# Patient Record
Sex: Female | Born: 1955
Health system: Southern US, Community
[De-identification: ages and names within clinical notes are randomized; demographics above are authoritative.]

## PROBLEM LIST (undated history)

## (undated) DIAGNOSIS — I10 Essential (primary) hypertension: Secondary | ICD-10-CM

## (undated) DIAGNOSIS — T8859XA Other complications of anesthesia, initial encounter: Secondary | ICD-10-CM

## (undated) DIAGNOSIS — M1990 Unspecified osteoarthritis, unspecified site: Secondary | ICD-10-CM

## (undated) DIAGNOSIS — T4145XA Adverse effect of unspecified anesthetic, initial encounter: Secondary | ICD-10-CM

## (undated) DIAGNOSIS — D649 Anemia, unspecified: Secondary | ICD-10-CM

## (undated) DIAGNOSIS — K219 Gastro-esophageal reflux disease without esophagitis: Secondary | ICD-10-CM

## (undated) DIAGNOSIS — E7800 Pure hypercholesterolemia, unspecified: Secondary | ICD-10-CM

## (undated) DIAGNOSIS — E119 Type 2 diabetes mellitus without complications: Secondary | ICD-10-CM

## (undated) DIAGNOSIS — K859 Acute pancreatitis without necrosis or infection, unspecified: Secondary | ICD-10-CM

## (undated) DIAGNOSIS — F419 Anxiety disorder, unspecified: Secondary | ICD-10-CM

## (undated) DIAGNOSIS — F32A Depression, unspecified: Secondary | ICD-10-CM

## (undated) DIAGNOSIS — F329 Major depressive disorder, single episode, unspecified: Secondary | ICD-10-CM

## (undated) HISTORY — PX: BACK SURGERY: SHX140

## (undated) HISTORY — PX: SHOULDER SURGERY: SHX246

## (undated) HISTORY — PX: TONSILLECTOMY: SUR1361

## (undated) HISTORY — PX: CHOLECYSTECTOMY: SHX55

## (undated) HISTORY — PX: COLONOSCOPY: SHX174

## (undated) HISTORY — PX: ABDOMINAL HYSTERECTOMY: SHX81

---

## 1998-08-03 ENCOUNTER — Ambulatory Visit (HOSPITAL_COMMUNITY): Admission: RE | Admit: 1998-08-03 | Discharge: 1998-08-03 | Payer: Self-pay | Admitting: Gynecology

## 1998-08-05 ENCOUNTER — Inpatient Hospital Stay (HOSPITAL_COMMUNITY): Admission: RE | Admit: 1998-08-05 | Discharge: 1998-08-08 | Payer: Self-pay | Admitting: Gynecology

## 1998-12-02 ENCOUNTER — Encounter: Admission: RE | Admit: 1998-12-02 | Discharge: 1999-03-02 | Payer: Self-pay | Admitting: Internal Medicine

## 1999-09-18 ENCOUNTER — Encounter: Payer: Self-pay | Admitting: Emergency Medicine

## 1999-09-18 ENCOUNTER — Inpatient Hospital Stay (HOSPITAL_COMMUNITY): Admission: EM | Admit: 1999-09-18 | Discharge: 1999-09-21 | Payer: Self-pay | Admitting: *Deleted

## 1999-09-19 ENCOUNTER — Encounter: Payer: Self-pay | Admitting: Neurology

## 2000-03-26 ENCOUNTER — Encounter: Admission: RE | Admit: 2000-03-26 | Discharge: 2000-04-05 | Payer: Self-pay | Admitting: Internal Medicine

## 2000-11-29 ENCOUNTER — Encounter: Admission: RE | Admit: 2000-11-29 | Discharge: 2000-11-29 | Payer: Self-pay | Admitting: Internal Medicine

## 2000-11-29 ENCOUNTER — Encounter: Payer: Self-pay | Admitting: Internal Medicine

## 2000-12-17 ENCOUNTER — Encounter: Payer: Self-pay | Admitting: Neurosurgery

## 2000-12-19 ENCOUNTER — Ambulatory Visit (HOSPITAL_COMMUNITY): Admission: RE | Admit: 2000-12-19 | Discharge: 2000-12-20 | Payer: Self-pay | Admitting: Neurosurgery

## 2000-12-19 ENCOUNTER — Encounter: Payer: Self-pay | Admitting: Neurosurgery

## 2002-03-24 ENCOUNTER — Encounter: Payer: Self-pay | Admitting: Internal Medicine

## 2002-03-24 ENCOUNTER — Encounter: Admission: RE | Admit: 2002-03-24 | Discharge: 2002-03-24 | Payer: Self-pay | Admitting: Internal Medicine

## 2002-04-02 ENCOUNTER — Encounter: Admission: RE | Admit: 2002-04-02 | Discharge: 2002-04-02 | Payer: Self-pay | Admitting: Internal Medicine

## 2002-04-02 ENCOUNTER — Encounter: Payer: Self-pay | Admitting: Internal Medicine

## 2002-07-17 ENCOUNTER — Inpatient Hospital Stay (HOSPITAL_COMMUNITY): Admission: EM | Admit: 2002-07-17 | Discharge: 2002-07-23 | Payer: Self-pay | Admitting: Psychiatry

## 2002-09-23 ENCOUNTER — Encounter: Payer: Self-pay | Admitting: Internal Medicine

## 2002-09-23 ENCOUNTER — Encounter: Admission: RE | Admit: 2002-09-23 | Discharge: 2002-09-23 | Payer: Self-pay | Admitting: Internal Medicine

## 2003-04-21 ENCOUNTER — Other Ambulatory Visit: Admission: RE | Admit: 2003-04-21 | Discharge: 2003-04-21 | Payer: Self-pay | Admitting: Obstetrics and Gynecology

## 2004-12-20 ENCOUNTER — Encounter: Admission: RE | Admit: 2004-12-20 | Discharge: 2004-12-20 | Payer: Self-pay | Admitting: Internal Medicine

## 2005-12-01 ENCOUNTER — Encounter: Admission: RE | Admit: 2005-12-01 | Discharge: 2005-12-01 | Payer: Self-pay | Admitting: Internal Medicine

## 2006-01-17 ENCOUNTER — Encounter: Admission: RE | Admit: 2006-01-17 | Discharge: 2006-01-17 | Payer: Self-pay | Admitting: Internal Medicine

## 2007-02-28 ENCOUNTER — Encounter: Admission: RE | Admit: 2007-02-28 | Discharge: 2007-02-28 | Payer: Self-pay | Admitting: Internal Medicine

## 2009-01-01 ENCOUNTER — Observation Stay (HOSPITAL_COMMUNITY): Admission: EM | Admit: 2009-01-01 | Discharge: 2009-01-04 | Payer: Self-pay | Admitting: Emergency Medicine

## 2009-01-04 ENCOUNTER — Inpatient Hospital Stay (HOSPITAL_COMMUNITY): Admission: AD | Admit: 2009-01-04 | Discharge: 2009-01-07 | Payer: Self-pay | Admitting: Psychiatry

## 2009-01-04 ENCOUNTER — Ambulatory Visit: Payer: Self-pay | Admitting: Psychiatry

## 2010-01-04 ENCOUNTER — Encounter: Admission: RE | Admit: 2010-01-04 | Discharge: 2010-01-04 | Payer: Self-pay | Admitting: Internal Medicine

## 2010-09-18 LAB — DIFFERENTIAL
Eosinophils Absolute: 0.3 10*3/uL (ref 0.0–0.7)
Eosinophils Relative: 3 % (ref 0–5)
Lymphocytes Relative: 44 % (ref 12–46)
Monocytes Absolute: 0.8 10*3/uL (ref 0.1–1.0)
Monocytes Relative: 7 % (ref 3–12)
Neutrophils Relative %: 46 % (ref 43–77)

## 2010-09-18 LAB — CBC
MCHC: 35.8 g/dL (ref 30.0–36.0)
MCV: 86.9 fL (ref 78.0–100.0)
Platelets: 277 10*3/uL (ref 150–400)
RDW: 13.7 % (ref 11.5–15.5)
WBC: 11.3 10*3/uL — ABNORMAL HIGH (ref 4.0–10.5)

## 2010-09-18 LAB — URINALYSIS, ROUTINE W REFLEX MICROSCOPIC
Bilirubin Urine: NEGATIVE
Glucose, UA: NEGATIVE mg/dL
Nitrite: NEGATIVE
Specific Gravity, Urine: 1.006 (ref 1.005–1.030)

## 2010-09-18 LAB — GLUCOSE, CAPILLARY
Glucose-Capillary: 100 mg/dL — ABNORMAL HIGH (ref 70–99)
Glucose-Capillary: 102 mg/dL — ABNORMAL HIGH (ref 70–99)
Glucose-Capillary: 111 mg/dL — ABNORMAL HIGH (ref 70–99)
Glucose-Capillary: 122 mg/dL — ABNORMAL HIGH (ref 70–99)
Glucose-Capillary: 130 mg/dL — ABNORMAL HIGH (ref 70–99)
Glucose-Capillary: 160 mg/dL — ABNORMAL HIGH (ref 70–99)
Glucose-Capillary: 199 mg/dL — ABNORMAL HIGH (ref 70–99)
Glucose-Capillary: 78 mg/dL (ref 70–99)
Glucose-Capillary: 83 mg/dL (ref 70–99)
Glucose-Capillary: 99 mg/dL (ref 70–99)
Glucose-Capillary: 143 mg/dL — ABNORMAL HIGH (ref 70–99)
Glucose-Capillary: 224 mg/dL — ABNORMAL HIGH (ref 70–99)

## 2010-09-18 LAB — RAPID URINE DRUG SCREEN, HOSP PERFORMED
Cocaine: NOT DETECTED
Opiates: NOT DETECTED
Tetrahydrocannabinol: NOT DETECTED

## 2010-09-18 LAB — POCT I-STAT, CHEM 8
Glucose, Bld: 115 mg/dL — ABNORMAL HIGH (ref 70–99)
Hemoglobin: 13.3 g/dL (ref 12.0–15.0)
TCO2: 20 mmol/L (ref 0–100)

## 2010-09-18 LAB — POCT PREGNANCY, URINE: Preg Test, Ur: NEGATIVE

## 2010-09-29 ENCOUNTER — Emergency Department (HOSPITAL_COMMUNITY): Payer: Medicare Other

## 2010-09-29 ENCOUNTER — Observation Stay (HOSPITAL_COMMUNITY)
Admission: EM | Admit: 2010-09-29 | Discharge: 2010-09-30 | Disposition: A | Discharge status: Home / Self Care | Payer: Medicare Other | Attending: Internal Medicine | Admitting: Internal Medicine

## 2010-09-29 DIAGNOSIS — R209 Unspecified disturbances of skin sensation: Secondary | ICD-10-CM | POA: Insufficient documentation

## 2010-09-29 DIAGNOSIS — F411 Generalized anxiety disorder: Secondary | ICD-10-CM | POA: Insufficient documentation

## 2010-09-29 DIAGNOSIS — R079 Chest pain, unspecified: Principal | ICD-10-CM | POA: Insufficient documentation

## 2010-09-29 DIAGNOSIS — R05 Cough: Secondary | ICD-10-CM | POA: Insufficient documentation

## 2010-09-29 DIAGNOSIS — E119 Type 2 diabetes mellitus without complications: Secondary | ICD-10-CM | POA: Insufficient documentation

## 2010-09-29 DIAGNOSIS — R059 Cough, unspecified: Secondary | ICD-10-CM | POA: Insufficient documentation

## 2010-09-29 DIAGNOSIS — R630 Anorexia: Secondary | ICD-10-CM | POA: Insufficient documentation

## 2010-09-29 DIAGNOSIS — F988 Other specified behavioral and emotional disorders with onset usually occurring in childhood and adolescence: Secondary | ICD-10-CM | POA: Insufficient documentation

## 2010-09-29 DIAGNOSIS — E785 Hyperlipidemia, unspecified: Secondary | ICD-10-CM | POA: Insufficient documentation

## 2010-09-29 DIAGNOSIS — F3289 Other specified depressive episodes: Secondary | ICD-10-CM | POA: Insufficient documentation

## 2010-09-29 DIAGNOSIS — I10 Essential (primary) hypertension: Secondary | ICD-10-CM | POA: Insufficient documentation

## 2010-09-29 DIAGNOSIS — F329 Major depressive disorder, single episode, unspecified: Secondary | ICD-10-CM | POA: Insufficient documentation

## 2010-09-29 LAB — POCT I-STAT, CHEM 8
BUN: 19 mg/dL (ref 6–23)
Calcium, Ion: 1.16 mmol/L (ref 1.12–1.32)
Chloride: 105 mEq/L (ref 96–112)
Creatinine, Ser: 1 mg/dL (ref 0.4–1.2)

## 2010-09-29 LAB — CK TOTAL AND CKMB (NOT AT ARMC)
CK, MB: 2.6 ng/mL (ref 0.3–4.0)
Relative Index: 1.8 (ref 0.0–2.5)

## 2010-09-29 LAB — BASIC METABOLIC PANEL
GFR calc non Af Amer: >60 mL/min (ref >60)
Potassium: 5.9 mEq/L — ABNORMAL HIGH (ref 3.5–5.1)
Sodium: 127 mEq/L — ABNORMAL LOW (ref 135–145)

## 2010-09-29 LAB — CBC
HCT: 35.5 % — ABNORMAL LOW (ref 36.0–46.0)
MCV: 86 fL (ref 78.0–100.0)
RBC: 4.13 MIL/uL (ref 3.87–5.11)
RDW: 13.9 % (ref 11.5–15.5)
WBC: 9 10*3/uL (ref 4.0–10.5)

## 2010-09-29 LAB — DIFFERENTIAL
Basophils Relative: 1 % (ref 0–1)
Eosinophils Relative: 3 % (ref 0–5)
Monocytes Absolute: 1.3 10*3/uL — ABNORMAL HIGH (ref 0.1–1.0)
Neutro Abs: 4.8 10*3/uL (ref 1.7–7.7)
Neutrophils Relative %: 54 % (ref 43–77)

## 2010-09-29 LAB — POCT CARDIAC MARKERS
CKMB, poc: 2.3 ng/mL (ref 1.0–8.0)
Troponin i, poc: <0.05 ng/mL (ref 0.00–0.09)

## 2010-09-29 LAB — GLUCOSE, CAPILLARY: Glucose-Capillary: 214 mg/dL — ABNORMAL HIGH (ref 70–99)

## 2010-09-30 LAB — LIPID PANEL: LDL Cholesterol: UNDETERMINED mg/dL (ref 0–99)

## 2010-09-30 LAB — GLUCOSE, CAPILLARY
Glucose-Capillary: 126 mg/dL — ABNORMAL HIGH (ref 70–99)
Glucose-Capillary: 152 mg/dL — ABNORMAL HIGH (ref 70–99)

## 2010-09-30 LAB — CARDIAC PANEL(CRET KIN+CKTOT+MB+TROPI)
CK, MB: 1.9 ng/mL (ref 0.3–4.0)
Relative Index: 1.8 (ref 0.0–2.5)
Total CK: 48 U/L (ref 7–177)
Troponin I: 0.02 ng/mL (ref 0.00–0.06)

## 2010-09-30 LAB — BASIC METABOLIC PANEL
GFR calc non Af Amer: >60 mL/min (ref >60)
Potassium: 4 mEq/L (ref 3.5–5.1)
Sodium: 136 mEq/L (ref 135–145)

## 2010-10-01 NOTE — H&P (Addendum)
[name]              ACCOUNT NO.:  [id number]  MEDICAL RECORD NO.:  [id number]           PATIENT TYPE:  E  LOCATION:  [facility]                         FACILITY:  [facility]  PHYSICIAN:  Lonia Blood, M.D.       DATE OF BIRTH:  [birth year]  DATE OF ADMISSION:  09/29/2010 DATE OF DISCHARGE:                             HISTORY & PHYSICAL   PRIMARY CARE PROVIDER:  Deirdre Peer. Polite, MD  HISTORY OF PRESENT ILLNESS:  [name] is a 55 year old female with a history of anxiety and depression who presents to the [facility] ED with chief complaint of chest pain and some upper extremity numbness, tingling, and anxiety.  She indicates that this symptoms started about 3 days ago and have persisted on an intermittent basis until today when the chest pain became constant.  She describes the pain as sharp and located in the substernal area with occasional radiation to the left anterior chest. She denies any diaphoresis, nausea, vomiting, headache, or visual disturbances.  She denies any recent illness, abdominal pain, or diarrhea.  She rates the pain 5/10 and it is worst and 2/10 at best. She indicates that activity does not affect with pain and that there seems to be no relationship with the deep inspiration.  Symptoms came on, gradually have persisted.  Symptoms are characterized as mild to moderate.  We are asked to admit for further evaluation and treatment.  ALLERGIES:  No known drug allergies.  PAST MEDICAL HISTORY: 1. Schizo-affective disorder with depressed mood. 2. Attention deficit disorder. 3. Hypertension. 4. Elevated triglycerides. 5. Depression and anxiety. 6. Diabetes type 2. 7. Questionable history of TIA. 8. Seasonal allergies.  PAST SURGICAL HISTORY: 1. Remote tonsillectomy. 2. Cholecystectomy in 1999. 3. Hysterectomy in 2000. 4. Shoulder surgery. 5. Hemilaminectomy and diskectomy at L5-S1 in 2002.  FAMILY MEDICAL HISTORY:  Was reviewed with the patient and  is noncontributory to this admission.  SOCIAL HISTORY:  The patient is married.  Denies tobacco use.  Denies EtOH.  Denies IV drug use.  MEDICATIONS: 1. Tylenol 325 mg 2 tabs p.o. daily. 2. Biotin 500 mcg p.o. b.i.d. 3. Prilosec 20 mg p.o. daily. 4. Aspirin 81 mg p.o. daily. 5. Carbamazepine 200 mg p.o. 0.5 tabs 4 times daily. 6. Bupropion XL 150 mg p.o. 3 tablets at bedtime. 7. Trazodone 100 mg p.o. 0.5 tabs every evening. 8. Metformin 500 mg p.o. 1-2 tabs b.i.d. takes 2 tabs in the morning     and 1 tablet at night. 9. Lisinopril 20 mg p.o. 1 tab daily. 10.Paroxetine 40 mg p.o. 1.5 tab every evening. 11.Simvastatin 20 mg p.o. 1 tablet daily. 12.Januvia 100 mg p.o. 1 tab daily.  REVIEW OF SYSTEMS:  GENERAL:  Negative for fever, chills, anorexia, and unintentional weight loss. ENT:  Negative for ear pain, nasal congestion, and sore throat. CARDIOVASCULAR:  See HPI. RESPIRATORY:  Negative for increased work of breathing or cough. MUSCULOSKELETAL:  Positive for some chronic intermittent neck pain. Negative for muscle weakness. NEUROLOGIC:  Negative for headache, visual disturbances.  Positive for some intermittent numbness, tingling of her upper extremities. GASTROINTESTINAL:  Negative abdominal pain, nausea, vomiting,  constipation, diarrhea, or melena. GENITOURINARY:  Negative dysuria, hematuria, frequency, or urgency. PSYCHIATRIC:  Positive for depression and anxiety. HEMATOLOGIC:  Negative for any unusual bruising or bleeding.  LABORATORY DATA:  Sodium 127, potassium 5.9 however hemolyzed specimen. Chloride 97, CO2 of 20, BUN 13, creatinine 0.42, and glucose 147.  WBC 9.0, hemoglobin 13.1, hematocrit 35.5, and platelets 275.  CK-MB 2.3, troponin 1 less than 0.05, myoglobin 73.8.  RADIOLOGY DATA:  Chest x-ray yields negative.  No acute cardiopulmonary abnormality.  PHYSICAL EXAMINATION:  VITAL SIGNS:  T 98.2, blood pressure 134/69, heart rate is 69, respiration is 22, and  sats 100% on room air. GENERAL:  Awake, alert, well-nourished, and well-hydrated.  No acute distress. HEAD:  Normocephalic, atraumatic.  Pupils are equal, round, and reactive to light.  EOMI.  Mucous membranes of her mouth are moist and pink.  No obvious lesion or exudate in nose or ears. NECK:  Supple.  No JVD.  Full range of motion.  No lymphadenopathy. CARDIOVASCULAR:  Regular rate and rhythm.  No murmur, gallop, or rub. No lower extremity edema. RESPIRATORY:  No increased work of breathing.  Breath sounds clear to auscultation bilaterally.  No rhonchi, wheezes, or rales. ABDOMEN:  Round, soft.  Positive bowel sounds throughout.  Nontender to palpation.  No mass or organomegaly noted. NEUROLOGIC:  Alert and oriented x3.  Speech clear.  Facial symmetry. Cranial nerves II through XII are grossly intact. MUSCULOSKELETAL:  Moves all extremities.  No joint swelling/erythema. EXTREMITIES:  Without clubbing or cyanosis.  ASSESSMENT/PLAN: 1. Chest pain.  Atypical.  We will admit to tele for observation.  We     will cycle cardiac enzymes, provide O2 support, as well as nitro,     morphine, and an antiemetic as needed.  We will repeat an EKG in     the a.m.  We will continue her aspirin. 2. Hyponatremia.  We will gently hydrate and recheck in the a.m. 3. Hyperkalemia hemolyzed specimen.  We will repeat. 4. Diabetes type 2.  We will check her hemoglobin A1c and use sliding     scale glycemic control.  We will continue her home meds. 5. Hyperlipidemia.  We will get a fasting lipid panel.  Continue her     statin. 6. Depression/anxiety.  Currently seems to be at baseline.  We will     continue her home meds. 7. DVT prophylaxis.  We will use Lovenox. 8. Code status.  The patient is a full code.  This assessment and plan was discussed with Dr. Lavera Guise.     Gwenyth Bender, NP   ______________________________ Lonia Blood, M.D.    KMB/MEDQ  D:  09/29/2010  T:  09/29/2010  Job:   [id number]  cc:   Deirdre Peer. Polite, M.D.  Electronically Signed by Lonia Blood M.D. on 10/01/2010 03:46:10 PM Electronically Signed by Toya Smothers  on 10/06/2010 03:09:31 PM

## 2010-10-25 NOTE — Discharge Summary (Signed)
NAMEAMORI, [name]              ACCOUNT NO.:  [id number]   MEDICAL RECORD NO.:  [id number]          PATIENT TYPE:  OBV   LOCATION:  4740                         FACILITY:  [facility]   PHYSICIAN:  Theressa Millard, M.D.    DATE OF BIRTH:  1955-06-18   DATE OF ADMISSION:  01/01/2009  DATE OF DISCHARGE:  01/03/2009                               DISCHARGE SUMMARY   ADMISSION DIAGNOSIS:  Drug overdose.   DISCHARGE DIAGNOSES:  1. Drug overdose with clonazepam.  2. Schizoaffective disorder.  3. Diabetes mellitus.  4. Hypertension.  5. Allergic rhinitis.   The patient is a 55 year old white female with a history of a  schizoaffective disorder who had an argument with her husband on the  night prior to admission and took somewhere between 80 and 100 Klonopin  0.5 mg.  She was brought to the emergency department.   HOSPITAL COURSE:  The patient was admitted for observation because of  sedation risks and concern of lowering seizure threshold.  Her other  psychotropic medications were held except for paroxetine which was  continued to avoid withdrawal.  She did well over the subsequent 36  hours and felt much better.  She was seen in consultation by Dr. Dub Mikes  and is willing to go to [facility] for additional  therapies, and this will be arranged at this time.   Please note that the patient's lisinopril is being held at this point.  She has a history of hypertension but has recently loss 50 pounds.  We  noted that her blood pressure was quite good in acute care hospital  without lisinopril, so this will be held at this point.  However, if her  blood pressure starts to rise as she is up and about, lisinopril 10 mg  daily could be resumed.  She has also had some problems with diarrhea  from glipizide, although she has tolerated metformin without problems.  At this point we will hold glipizide as well.   DISCHARGE MEDICATIONS:  1. Metformin 500 mg 2 tablets b.i.d. with food.  2.  Simvastatin 20 mg daily.  3. Baby aspirin daily.  4. Claritin 10 mg daily.  5. Paroxetine ER 25 mg 2 daily.   Other medications which were not given in this hospitalization but were  taken prior to admission include lamotrigine 100 mg one daily, bupropion  XL 150 mg 3 daily and clonazepam 0.5 mg 2 daily p.r.n.   ACTIVITY:  As tolerated.   DIET:  No added salt, modified carbohydrate.   She will be transferred voluntarily to [facility].      Theressa Millard, M.D.  Electronically Signed     JO/MEDQ  D:  01/03/2009  T:  01/03/2009  Job:  [id number]

## 2010-10-25 NOTE — Discharge Summary (Signed)
  NAMEZAYNE, [name]              ACCOUNT NO.:  [id number]  MEDICAL RECORD NO.:  [id number]           PATIENT TYPE:  LOCATION:                                 FACILITY:  PHYSICIAN:  Deirdre Peer. Domenic Schoenberger, M.D. DATE OF BIRTH:  1955/09/12  DATE OF ADMISSION: DATE OF DISCHARGE:                              DISCHARGE SUMMARY   DISCHARGE DIAGNOSES: 1. Atypical chest pain, enzymes negative for ischemia, EKG without     acute changes. Discharged to home in stable condition.  Of note,     the patient had previous outpatient stress test in 2010 which was     normal. 2. Hyponatremia, resolved after intravenous fluids.  The patient will     have further outpatient followup, rule out related to new     psychiatric medication, carbamazepine. 3. Diabetes. 4. Hypertension. 5. Schizoaffective disorder.  DISCHARGE MEDICATIONS: 1. Aspirin 81 mg daily. 2. Biotin b.i.d. 3. Bupropion 150 mg three tablets daily. 4. Carbamazepine 200 mg half tablet four times a day. 5. Januvia 100 mg daily. 6. Lisinopril 20 mg daily. 7. Metformin 500 mg two tabs in the morning, one in the p.m. 8. Omeprazole 20 mg daily. 9. Paroxetine 40 mg one and half daily. 10.Simvastatin 20 mg daily. 11.Trazodone 100 mg half nightly.  DISPOSITION:  Discharged home in stable condition.  HISTORY OF PRESENT ILLNESS:  This 55 year old female presents to the ED with complaint of chest pain at rest.  Because of risk factors, admission was deemed necessary for rule out.  Please see dictated H and P for further details.  Past medical history, medications, social history, past surgical history, allergies, family history per admission H and P.  HOSPITAL COURSE:  The patient was admitted to the telemetry floor bed for evaluation and treatment of chest pain.  The patient had serial cardiac enzymes which were negative for ischemia.  EKG without any acute changes.  Chest x-ray was negative for acute cardiopulmonary abnormality.  There  was no recurrence of chest pain.  The patient's symptoms were atypical and because of her having a normal stress test in 2010, she was felt to be stable for discharge to home.  The patient did have hyponatremia.  Followup labs showed normalization of sodium, we will have further outpatient check on this issue.  The patient has several other chronic medical problems which were stable.  She will continue meds as discussed above.     Deirdre Peer. Peregrine Nolt, M.D.     RDP/MEDQ  D:  10/05/2010  T:  10/05/2010  Job:  [id number]  Electronically Signed by Windy Fast Penney Domanski M.D. on 10/25/2010 02:42:52 PM

## 2010-10-25 NOTE — H&P (Signed)
NAMESHAKETHA, [name]              ACCOUNT NO.:  [id number]   MEDICAL RECORD NO.:  [id number]          PATIENT TYPE:  EMS   LOCATION:  [facility]                         FACILITY:  [facility]   PHYSICIAN:  Ramiro Harvest, MD    DATE OF BIRTH:  May 26, 1956   DATE OF ADMISSION:  01/01/2009  DATE OF DISCHARGE:                              HISTORY & PHYSICAL   PRIMARY CARE PHYSICIAN:  Deirdre Peer. Polite, M.D. of [place].   HISTORY OF PRESENT ILLNESS:  [name] is a 55 year old white  female with history of schizoaffective disorder with depressed mood,  depression, anxiety, hypertension, hyperlipidemia, diabetes who presents  to the ED with a drug overdose.  Per patient and her husband, the  patient got into an argument with her husband the night prior to  admission.  After the fight, the patient decided to go to her room to be  away from her husband.  The patient's husband went to check on her 45  minutes later.  The patient asked for a drink for her medications and  her medications.  The patient's husband got her medications as well as a  drink and then after that the patient went to bed.  The patient awoke  around 3:00 a.m. was with incoherent speech and on the way to the  bathroom was dragging herself unusually.  Husband asked her how much of  the medication she took, and she had stated that she took the whole  bottle of her Klonopin which was approximately about 80-100 tablets left  in it.  The patient's husband proceeded to bring the patient to the ED.  The patient stated that she wanted to escape and go away.  Also,  positive for suicidal ideations.  In the ED, urinalysis done was  negative.  UDS was positive for benzodiazepines.  Urine pregnancy was  negative.  I-stat-8 was within normal limits.  Salicylate level was less  than four.  Tylenol level was less than 10.0.  Alcohol level was less  than 5.  CBC with a white count of 11.3, with no left shift, otherwise  was within  normal limits.  We were asked to admit the patient for  observation.   ALLERGIES:  NO KNOWN DRUG ALLERGIES.   PAST MEDICAL HISTORY:  1. Schizoaffective disorder with a depressed mood.  2. Attention deficit disorder.  3. Hypertension.  4. Elevated triglycerides.  5. Depression and anxiety.  6. Status post tonsillectomy.  7. Status post cholecystectomy 1999.  8. Status post hysterectomy in 2000.  9. Status post shoulder surgery.  10.History of endometriosis.  11.Diabetes.  12.Questionable history of a TIA.  13.Status post right L5-S1 semi hemilaminectomy with diskectomy and      micro dissection per Dr. Franky Macho December 19, 2000.  14.Seasonal allergies.   HOME MEDICATIONS:  1. Metformin 500 mg p.o. b.i.d.  2. Glipizide ER 10 mg p.o. b.i.d.  3. Simvastatin 20 mg p.o. daily.  4. Lisinopril 10 mg p.o. daily.  5. Aspirin 81 mg p.o. daily.  6. Claritin 10 mg p.o. daily.  7. Paxil 25 mg 2 tablets p.o. daily.  8. Lamotrigine 100 mg p.o. daily.  9. Bupropion XL 150 mg 3 tablets p.o. daily.  10.Clonazepam 0.5 mg one tablet p.o. b.i.d. and 2 tablets p.o. q.h.s.   SOCIAL HISTORY:  The patient is married, no tobacco use.  No alcohol  use.  No IV drug use.   FAMILY HISTORY:  Noncontributory.   REVIEW OF SYSTEMS:  As per HPI, otherwise negative.   PHYSICAL EXAMINATION:  VITAL SIGNS:  Temperature 97.3, blood pressure  131/70, pulse of 122 down to 96, respiratory rate is 16, satting 98% on  room air.  GENERAL:  This patient in no apparent distress.  HEENT:  Normocephalic, atraumatic.  Pupils equal, round and reactive to  light and accommodation.  Extraocular movements intact.  Oropharynx is  clear.  No lesions, no exudates.  NECK:  Supple.  No lymphadenopathy.  Dry mucous membranes.  RESPIRATORY:  Clear to auscultation bilaterally.  No wheezes, no  crackles or rhonchi.  CARDIOVASCULAR:  Regular rate and rhythm.  No murmurs, rubs or gallops.  ABDOMEN:  Soft, nontender, nondistended.   Positive bowel sounds.  EXTREMITIES:  No clubbing, cyanosis or edema.  NEUROLOGICAL:  The patient is alert and oriented x3.  Cranial nerves II-  XII are grossly intact.  No focal deficits.   ADMISSION LABORATORY DATA:  CBC white count 11.3, hemoglobin 13.5,  hematocrit 37.6, platelet count of 277, ANC of 5.2 alcohol level was  less than 5.  Acetaminophen level less than 10.  Salicylate level less  than 4.0.  I-stat-8:  hemoglobin 13.3, hematocrit 39.0, sodium 136,  potassium 4.5, chloride 109, glucose 115, BUN 14, creatinine 0.9.  Urine  pregnancy test was negative.  Urinalysis was yellow, clear.  Specific  gravity 1.006, pH of 5, glucose negative, bilirubin negative, ketones  negative, blood negative, protein negative, urobilinogen 0.2, nitrite  negative, leukocytes negative.  Urine drug screen was positive for  benzodiazepines.  EKG showed a sinus tachycardia.   ASSESSMENT AND PLAN:  [name] is a 55 year old female with  history of depression, anxiety, schizoaffective disorder with depressed  mood, attention deficit disorder, hypertension, hyperlipidemia who  presents to the ED with a drug overdose.  1. Drug overdose.  The patient with a suicidal ideation, history of      her depression and schizoaffective disorder and anxiety.  The      patient is currently awake, alert and speaking appropriately,      protecting her airway well.  UDS was positive for benzyl only.      Salicylate level, acetaminophen level, alcohol level were all      negative.  EKG with a sinus tachycardia.  Will admit the patient to      telemetry for observation as the Klonopin interaction with some of      her psychiatric medications lower seizure threshold.  Will place      under seizure precautions and suicidal precautions.  We will hold      psych medications for now.  Check a C-met, check a chest x-ray.      Will need a psych consultation for further evaluation.  2. Type 2 diabetes.  Check a  hemoglobin A1c.  Hold oral hypoglycemics.      Place on sliding scale insulin and follow.  Per family, the patient      had diarrhea last week which they attributed likely secondary to a      glipizide which is being followed per PCP.  3. Hyperlipidemia.  Continue home  dose simvastatin.  4. Depression/anxiety.  Hold antidepressant and anxiolytics for now      secondary to problem #1.  Psych consultation.  5. Schizoaffective disorder.  Psych consult.  6. Hypertension.  Hold blood pressure medications for now.  7. Dehydration.  Hydrate with IV fluids.  8. Leukocytosis, likely a reactive leukocytosis.  Urinalysis is      negative.  No left shift.  Will check a chest x-ray.  No need for      antibiotics for now.  Will follow.  9. Sinus tachycardia, likely secondary to problem #1 and dehydration,      improved.  Hydrate with IV fluids and follow.  10.Prophylaxis.  Pepcid for GI prophylaxis.  Lovenox for DVT      prophylaxis.   It has been a pleasure taking care of [name].      Ramiro Harvest, MD  Electronically Signed     DT/MEDQ  D:  01/01/2009  T:  01/01/2009  Job:  [id number]   cc:   Deirdre Peer. Polite, M.D.

## 2010-10-28 NOTE — H&P (Signed)
NAME:  [name], [name]                        ACCOUNT NO.:  [id number]   MEDICAL RECORD NO.:  [id number]                   PATIENT TYPE:  IPS   LOCATION:  0301                                 FACILITY:  [facility]   PHYSICIAN:  Jeanice Lim, M.D.              DATE OF BIRTH:  [birth year]   DATE OF ADMISSION:  07/17/2002  DATE OF DISCHARGE:                         PSYCHIATRIC ADMISSION ASSESSMENT   IDENTIFYING INFORMATION:  The patient is a 55 year old married white female,  voluntarily admitted on July 17, 2002.   HISTORY OF PRESENT ILLNESS:  The patient presents with a history of  depression, suicidal ideation.  The patient feels stuck in her life and  feeling very helpless.  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  She told her psychiatrist that she had taken 20 trazodone tablets last  weekend to go somewhere else and to fade away.  Her husband had found  her.  She called her psychiatrist.  She had an appointment, who suggested  admission.  The patient denies any psychotic symptoms.  She states that Dr.  Anne Hahn thought that she was schizophrenic and paranoid.  The patient states  that she thought she was paranoid at one time, thought that someone had a  peephole in her apartment making videos.  She states that she is much better  now in regards to any paranoid ideation.  She denies any hallucinations.  Currently denies any suicidal or homicidal thoughts.  Her sleep has been  satisfactory.   PAST PSYCHIATRIC HISTORY:  First hospitalization at [facility].  Sees Dr. Anne Hahn as an outpatient.  Reports has been having adult  ADD.  No history of self-inflicted injury.   SOCIAL HISTORY:  This is a 55 year old married white female.  Married for 23  years, with no children.  She lives with her husband.  She is not working.  She draws disability.  No legal problems.   FAMILY HISTORY:  Unknown.   ALCOHOL AND DRUG HISTORY:  Nonsmoker.  Denies any alcohol or drug  use.   PRIMARY CARE Shanasia Ibrahim:  Dr. Merril Abbe in [place].   PAST MEDICAL HISTORY:  1. Hypertension.  2. Elevated cholesterol.   MEDICATIONS:  1. Effexor XR 150 mg b.i.d.  2. Depakote ER three times daily.  3. Xanax 0.5 mg three times daily.  4. Abilify 10 mg daily.  5. Trazodone 25 mg in the morning, 25 mg at 1500, 15 mg q.h.s.  6. Strattera 100 mg daily.   ALLERGIES:  No known drug allergies.   REVIEW OF SYSTEMS:  No cardiac or pulmonary problems.  No neurological or  hematological problems.  Reports a burning for the past few months.  The  patient has had a total hysterectomy.  ENT:  The patient wears glasses.  No  musculoskeletal or __________ mucosa.   PHYSICAL EXAMINATION:  VITAL SIGNS:  The patient is 5 feet 4 inches tall,  and  she is 212 pounds.  Her vital signs are 97.9, 98, 12 respirations, blood  pressure is 144/78.  GENERAL:  The patient is a 55 year old Caucasian female, overweight, neatly  dressed.  Appears her stated age.  She is well groomed, alert, and  cooperative.  HEENT:  Head is normocephalic, atraumatic.  She can raise her eyebrows.  Hair is long, gray, and evenly distributed.  Hearing is appropriate to  conversation.  No nasal discharge.  Her sclerae are clear.  LUNGS:  Her respirations are easy.  No cough is noted.  BREASTS:  Deferred.  MUSCULOSKELETAL:  Muscle tone appears equal bilaterally.  There are no signs  of injury.  The patient walks upright.  SKIN:  Warm color is good.  No rashes or lacerations were noted.  No tics or  tremors.   LABORATORY DATA:  Urine drug screen is positive for benzodiazepines,  positive for benzoclidine.  Valproic acid level is 36.5.   MENTAL STATUS EXAM:  She is alert and oriented, middle-aged female.  Cooperative.  Casually dressed.  Speech is clear and articulate.  Mood is  depressed.  Affect is pleasant.  Thought processes are coherent.  No  evidence of psychosis.  No auditory or visual hallucinations.  No  suicidal  or homicidal ideation.  Cognitive function intact.  Memory is fair.  Judgment and insight are poor.   AXIS I:  Major depression, adult attention-deficit disorder by history.   AXIS II:  Deferred.   AXIS III:  1. Hypertension.  2. Elevated cholesterol.   AXIS IV:  Problems with education, economic, access to health care services,  other sex issue problems.   AXIS V:  GAF 30, past year is 68-70.   Place on monitored admission for depression, suicidal ideation.  Contacted  for safety check every 15 minutes.  Will resume her medications.  We want to  stabilize her mood and thinking so patient can feel safe.  Will have a  family session.  Patient to follow up with Dr. Ilsa Iha.     Landry Corporal, N.P.                       Jeanice Lim, M.D.    JO/MEDQ  D:  07/23/2002  T:  07/23/2002  Job:  [id number]

## 2010-10-28 NOTE — H&P (Signed)
[place]  Patient:    [name], [name]                       MRN: [id number] Adm. Date:  12/19/00 Attending:  Ronaldo Miyamoto L. Franky Macho, M.D.                         History and Physical  ADMITTING DIAGNOSES: 1. Displaced disk, right L5-S1. 2. Right S1 radiculopathy. 3. Degenerative disk disease, L5-S1.  INDICATIONS:  The patient is a 55 year old woman who presented to me on December 05, 2000, who is currently on disability and right-handed.  She has been having pain in her right lower extremity for the last two and a half months. The pain starts in the right buttocks and radiates down the rest of her right lower extremity posteriorly.  The bottom of her foot goes numb on the right side, as does the second or third toe.  Sometimes the leg will tingle and the entire leg starts to go numb.  The small of her back and around the hip joint also feel painful on the right side.  She has had some bladder leakage at times.  When she first stands, she has pain in the right hip which hurts and she waits until something pops -- as she puts it -- and then is able to walk afterwards.  She did stumble recently while stepping off the curb, which frightened her.  She has had no bowel problems.  She has not had pain like this in the past.  PAST MEDICAL HISTORY:  Hypertension, elevated triglycerides, depression and anxiety.  She has undergone a tonsillectomy, cholecystectomy, hysterectomy and shoulder surgery.  History of endometriosis.  ALLERGIES:  No known drug allergies.  MEDICATIONS: 1. Xanax 0.5 mg three to four a day as needed for anxiety. 2. Depakote 250 mg three times a day for anxiety. 3. Effexor 150 mg twice a day for depression. 4. Concerta 18 mg for ADHD. 5. Lopid 600 mg b.i.d. for hypertriglyceridemia. 6. Clonidine 0.1 mg p.o. b.i.d. 7. Climara patch for flashes. 8. Darvocet-N.  FAMILY MEDICAL HISTORY:  Mother died at age 19, had Alzheimers  disease. Father died at age 56, had cancer.  Diabetes, hypertension, atherosclerosis and cancer are present in the family history.  SOCIAL HISTORY:  She does not smoke, drink or use illicit drugs.  She is 5-feet 4-inches tall and weighs 210 pounds.  REVIEW OF SYSTEMS:  Positive for eye glasses, hypertension, urinary incontinence, leg weakness, back pain, anxiety, depression and attention deficit disorder.  PHYSICAL EXAMINATION:  VITAL SIGNS:  She has a pulse of 80.  NEUROLOGIC:  Memory, language, attention span and fund of knowledge are normal.  She is well-kempt and in no acute distress.  She is alert, oriented x 4 and answers all questions appropriately.  Pupils are equal, round and reactive to light.  She has full extraocular movements and normal visual fields.  Funduscopic examination is normal.  Symmetric facial sensation and symmetric facial movements.  Hearing intact to voice.  Uvula elevates in the midline.  Shoulder shrug is normal.  Tongue protrudes in the midline.  She has 5/5 strength in the upper and left lower extremities; mild weakness, 5-/5, in the right gastrocnemius; mild difficulty with toe walking, mild difficulty with single toe raises on the right side; positive straight leg raising on the right side; normal muscle tone, bulk and coordination.  Rombergs test negative.  Proprioception and pinprick are intact throughout.  Deep tendon reflexes are 2+ at the biceps, triceps, brachioradialis, left knee, right knee, left ankle and not elicited at the right ankle.  NECK:  There are no cervical masses or bruits.  LUNGS:  Lung fields are clear.  HEART:  Regular rhythm and rate.  No murmurs or rubs.  Pulses are good at the wrists and feet bilaterally.  IMAGING STUDY:  MRI shows a large disk herniation at L5-S1 on the right side, putting pressure on the right S1 nerve root.  She also has a desiccated disk at L5-S1 and L4-5.  Disk bulge at L4-5 -- minor spinal  stenosis has resulted in the disk bulk there.  There are abnormalities and a conus degenerated disk in the lower thoracic spine also.  ASSESSMENT:  The patient has a herniated disk at L5-S1 causing her problem. Examination and history are consistent with what is a right S1 radiculopathy. She had had steroids earlier and did not wish to pursue epidural steroid injections.  What I recommended then is that she undergo an operative decompression via laminectomy and diskectomy at L5-S1.  Risks of the procedure including bleeding, infection, no pain relief, bowel or bladder dysfunction, need for further surgery, disk recurrence, damage to the nerve root, and cerebrospinal fluid leak were discussed.  She understands and wishes to proceed. DD:  12/19/00 TD:  12/19/00 Job: [id number]

## 2010-10-28 NOTE — Discharge Summary (Signed)
[place]  Patient:    [name], [name]                MRN: [id number] Adm. Date:  [id number] Disc. Date: [id number] Attending:  Erich Montane CC:         Darius Bump, M.D.             Peter M. Swazil[place], M.D.                           Discharge Summary  PATIENT ADDRESS:  [address] [place], [place] [address].  DATE OF BIRTH:  Aug 10, 1955  REASON FOR ADMISSION:  This was the first [facility] admission for this 55 year old right-handed white married female from [place], [place] admitted from the emergency room for evaluation of speech disturbance.  HISTORY OF PRESENT ILLNESS:  This patient has a many year history of high blood pressure and elevated cholesterol, has been in good health most of her life.  The evening of admission, she was upstairs in her home reading and felt very removed from herself, as if she was outside of herself looking at herself.  Her speech became slurred.  She could read but she could not understand what she was reading.  She began interposing letters in her words. It was noted by her husband that the left side of her face was drawn and that she was frustrated.  The episode lasted about five minutes and was associated with some left occipital headache afterwards without associated chest pain or palpations.  She came to the emergency room.  She does have a known prior history of headaches and had migraine headaches 15 years ago without associated visual disturbances.  She has a history of carsickness as a child. There is no known history of cardiac disease but she does have a history of high blood pressure.  She does not take aspirin therapy.  There has been no history of head trauma, seizures, drug or alcohol abuse.  Four weeks prior to admission, she had an episode when she stood up and developed back pain and chest pain while she was visiting in [place].  PAST MEDICAL  HISTORY:  Significant for hypertension and attention deficit disorder, anxiety, depression, environmental allergies, hysterectomy in February 2000, cholecystectomy in 1999.  HABITS:  She does not smoke cigarettes or drink alcohol.  ALLERGIES:  She has no known allergies.  MEDICATIONS: 1. Tricor 67 mg 2 q.d. 2. Ritalin 10 mg b.i.d. 3. Effexor XR 150 mg b.i.d. 4. Depakote 500 mg t.i.d. 5. Climara patch once per week. 6. Xanax 0.5 mg t.i.d. 7. Prinivil 10 mg q.d. 8. Clonidine 0.1 mg b.i.d.  PHYSICAL EXAMINATION AT THE TIME OF ADMISSION:  Was unremarkable.  Blood pressure was 120/80 in the right and left arm and the heart rate was 80. There was no evidence of an aphasia.  LABORATORY DATA:  Revealed hemoglobin of 12.3, hematocrit 33.7, white blood cell count 7,800.  Platelets 301,000.  Polys 49%, lymphs 39%, monocytes 6%, eosinophils 2%, basophils 1%.  PTT was 29 seconds and an INR was 1.2.  Her 2-D echocardiogram showed thickened structure at the base of the anterior mitral valve leaflet on the left atrial side, and because of this a transesophageal echocardiogram was performed by Dr. Peter Swaziland.  This study, on September 20, 1999 showed the aortic valve and sinus to be normal.  There was a prominent Sinus of  Valsalva adjacent to the mitral valve, which did not appear to be aneurysmal.  The aortic valve was normal.  The mitral valve was well-seen.  There was a whiff of mitral regurgitation.  A bubble study was unremarkable.  Doppler studies of the carotid showed no ICA stenosis and vertebral flow was antegrade.  CT scan the evening of admission revealed no evidence of an abnormality on the study without contrast.  An MRI study of the brain was unremarkable.  An intracranial MR angiogram was negative.  There was no evidence of large or medium vessel disease.  There was a fetal origin of a left posterior cerebral artery.  The distal right vertebral artery was small but was  considered normal.  Her telemetry in the hospital revealed normal sinus rhythm.  Other laboratory studies included triglycerides at 492.  Sodium 137, potassium 4.4, chloride 104, glucose 98, BUN 13, creatinine 0.7.  PT initially was 13.9 with an INR of 1.2 and a PTT of 29.  She was therapeutic on heparin in the hospital.  Hemoglobin was 12.3 with repeat at 12.0 and 12.3 and on April 10 a hemoglobin was slightly low at 11.8.  There were 49% polys, 39% lymphs, 6% monocytes, 2% eosinophils, and 1% basophils.  PH was 7.32 on admission, PCO2 36.1, with a bicarb of 24 and a TCO2 25.0.  HOSPITAL COURSE:  She was admitted, had normal blood pressure, was followed on telemetry with normal sinus rhythm.  Had no recurrent episodes of aphasia in the hospital.  Did have a homocysteine level drawn the day of discharge.  It was thought that her symptoms could represent TIA versus migraine.  It was decided to discharge her to follow as an outpatient and ask her not to drive for one week.  IMPRESSION: 1. Aphasia, code 784.3. 2. Possible TIA, code 435.9. 3. Hypertension, code 796.2. 4. Depression, code 311. 5. Anxiety, code 300.00. 6. Tremor, code 333.1. 7. Attention deficit disorder, code 314.0. 8. Obesity, code 278.01. 9. Migraine, code 346.10.  At this time it is unclear whether this was a TIA or migraine event.  MEDICATIONS:  The plan is to discharge her on her home medications plus aspirin, which will include: 1. Aspirin 325 mg q.d. 2. Tricor 67 mg 2 p.o. q.d. 3. Ritalin 10 mg p.o. b.i.d. 4. Effexor 150 mg p.o. b.i.d. 5. Depakote 500 mg t.i.d. 6. Climara patch one every week. 7. Xanax 0.5 mg t.i.d. 8. Prinivil 10 mg q.d. 9. Clonidine 0.1 mg b.i.d.  SPECIAL INSTRUCTIONS:  She is not to drive a car for one week.  FOLLOW-UP:  She is to return to see Dr. Sandria Manly in three weeks.  DISPOSITION:  She is discharged improved from her prehospital status. DD:  09/21/99 TD:  09/21/99 Job:  [id number]

## 2010-10-28 NOTE — Discharge Summary (Signed)
NAME:  [name], MEAS NO.:  [id number]   MEDICAL RECORD NO.:  [id number]                   PATIENT TYPE:  IPS   LOCATION:  0301                                 [facility]ACILITY[facility]  [facility]   PHYSICIAN:  Jeanice Lim, M.D.              DATE OF BIRTH:  1955-09-04   DATE OF ADMISSION:  07/17/2002  DATE OF DISCHARGE:  07/23/2002                                 DISCHARGE SUMMARY   IDENTIFYING DATA:  This is a 55 year old married Caucasian female  voluntarily admitted, presenting with a history of depression and suicidal  ideation, feeling stuck in her life.  She told her psychiatrist she took 20  trazodone tablets last weekend to go somewhere else to fade away.  Her  husband had found her.  She called her psychiatrist, had an appointment who  suggested admission.  The patient denied psychotic symptoms to [employer].  Ilsa Iha, M.D., who thought that she was schizophrenic and paranoid.  She felt  that someone had a peephole in her apartment making videos and putting them  on the Internet.  She was reporting some paranoid, delusional thoughts.   MEDICATIONS:  1. Effexor XR 150 mg b.i.d.  2. Depakote ER t.i.d.  3. Xanax 0.5 mg t.i.d.  4. Abilify 10 mg daily.  5. Trazodone 25 mg q.a.m., 3 p.m., and 50 mg q.h.s.  6. Strattera 100 mg daily.   DRUG ALLERGIES:  No known drug allergies.   PHYSICAL EXAMINATION:  GENERAL: Essentially within normal limits.  NEUROLOGIC: Nonfocal.   LABORATORY DATA:  Routine admission labs: Urine drug screen positive for  benzodiazepines.  Valproic acid level 36.5.   MENTAL STATUS EXAM:  The patient was an alert, oriented, middle-aged female,  cooperative, casually dressed.  Speech: Clear and articulate.  Mood:  Depressed.  Affect: Pleasant.  Thought process: Goal directed.  Thought  content: Positive for paranoid delusions, which the patient lacks insight  into.  Cognitive: Intact.  No dangerous ideation.  Judgment and insight were  fair to  poor.   ADMISSION DIAGNOSES:   AXIS I:  1. Schizoaffective disorder, depressed type.  2. Attention-deficit disorder.   AXIS II:  None.   AXIS III:  1. Hypertension.  2. Elevated cholesterol.   AXIS IV:  Moderate problems with education, economics, and support system.   AXIS V:  [id number]   HOSPITAL COURSE:  The patient was admitted, ordered routine p.r.n.  medications, underwent further monitoring, and was encouraged to participate  in individual, group, and milieu therapy.  She was resumed on psychotropics.  Family session was ordered.  Trazodone was optimized, Abilify optimized.  The patient reported a positive response to clinical intervention.   CONDITION ON DISCHARGE:  Her condition on discharge: Mood was more stable  and euthymic.  Affect: Brighter.  Thought processes: Goal directed.  Thought  content: Negative for dangerous ideation.  The patient was less paranoid,  showing some increase  in insight and motivation to be compliant with  followup plan.   DISCHARGE MEDICATIONS:  1. Lisinopril 10 mg q.a.m.  2. Catapres 0.1 mg b.i.d.  3. Depakote ER 500 mg t.i.d.  4. Strattera 100 mg daily.  5. Effexor XR 150 mg b.i.d.  6. Xanax 0.5 mg t.i.d.  7. Estradiol 0.1 mg patch.  8. Trazodone 50 mg one half b.i.d. and two q.h.s.  9. Lopid 600 mg b.i.d.  10.      Abilify 15 mg daily.   FOLLOW UP:  The patient was to follow up with Mark P. Ilsa Iha, M.D., on  February 16, at 9:45.   DISCHARGE DIAGNOSES:   AXIS I:  1. Schizoaffective disorder, depressed type.  2. Attention-deficit disorder.   AXIS II:  None.   AXIS III:  1. Hypertension.  2. Elevated cholesterol.   AXIS IV:  Moderate problems with education, economics, and support system.   AXIS V:  Global assessment of functioning on discharge was 50-55.                                                Jeanice Lim, M.D.    JEM/MEDQ  D:  08/11/2002  T:  08/11/2002  Job:  [id number]

## 2010-10-28 NOTE — Discharge Summary (Signed)
[name], [name]              ACCOUNT NO.:  [id number]   MEDICAL RECORD NO.:  [id number]          PATIENT TYPE:  IPS   LOCATION:  0507                          FACILITY:  [facility]   PHYSICIAN:  Geoffery Lyons, M.D.      DATE OF BIRTH:  Dec 17, 1955   DATE OF ADMISSION:  01/04/2009  DATE OF DISCHARGE:  01/07/2009                               DISCHARGE SUMMARY   CHIEF COMPLAINT:  This is the second admission to [facility] for this 55 year old female married, no children, who was  initially admitted to [facility] after an overdose on Klonopin.  She  endorsed that she overdosed after an argument with her husband with whom  she has ongoing conflict.  She is on disability, works a few hours at  [employer] at [employer], also goes to school pursuing a degree  in [place], admits to conflict with the husband who seems to be, as she  claims strange , feels that he is to dependent on her.  Before this  admission, she got in an argument with him, went to the bathroom started  taking Klonopin, 5 pills at a time.  Claims she took a total of 55 of  the 0.5.  Does admit to a long-term feelings that she wants to die.  Says that on her birthday, she is happy because she is closer to dying  but denied that she would act out on the thoughts.   PAST PSYCHIATRIC HISTORY:  Sees Valente David for medication management  and Dr. Linton Rump for psychotherapy.  Had an [facility] several years ago in 2004.   MEDICAL HISTORY:  1. Diabetes mellitus.  2. Hypertension.   MEDICATION:  1. Metformin 500 two twice a day.  2. Simvastatin 20 mg per day.  3. Baby aspirin.  4. Claritin 10 mg per day.  5. Paxil CR 25 two daily.   PHYSICAL EXAM:  Failed to show any acute findings.   LABORATORY WORK:  Blood sugar fluctuated between 117 and 137.   Exam revealed an alert, cooperative female, spontaneous, engaging upon  this assessment, intellectualize, rationalizes, feels  that she wants to  be __________.  Matter-of-factly explains the way she sees life, how she  feels about the world and she feels that it is not worth living, but  would not hurt herself.  Endorsed that she pretty much accept the fact  that the husband is not going to change and at this stage of her  relationship, she is not going anywhere.  Denied any homicidal  ideations.  No delusions.  No hallucinations.  Cognition well-preserved.   Axis I.  Bipolar disorder, depressed.  Axis II: No diagnosis.  Axis III:  Diabetes mellitus, active hypertension.  Axis IV:  Moderate.  Axis V:  Upon admission 35-40,  in the last year 75-80.   COURSE IN THE HOSPITAL:  She was transferred from [facility]  Unit after she was stabilized.  She was started on individual  psychotherapy.  She felt that it was not a matter of medication  adjustments, that her response was precipitated to the situation she was  in with her husband.  Says that the Lamictal has made a big difference  with her mood.  There was a family session July 29.  He had no concerns  about her safety.  She did endorse that although she is tired of living,  she would not hurt herself.  She was able to share with the husband the  fact that she felt that he gets hysterical and that creates a lot of  anxiety in the relationship but overall it went well.  She was  requesting discharge.  She had been in the Medical Unit for several  days, she felt better, rested and she has work and school waiting for  her.   DISCHARGE DIAGNOSES:  AXIS I: Bipolar disorder depressed.  AXIS II: No diagnosis.  AXIS III:  Hypertension.  AXIS IV: Moderate.  AXIS V:  Upon discharge 55-60.   DISCHARGE MEDICATIONS:  1. Paxil CR 25 mg 2 daily.  2. Lamictal 50 mg per day with plan to increase to 100.  3. Baby aspirin 81 mg per day.  4. Simvastatin 20 mg per day.  5. Metformin 500 mg twice a day.   FOLLOW UP:  With Miguel Aschoff and Valente David.       Geoffery Lyons, M.D.  Electronically Signed     IL/MEDQ  D:  02/04/2009  T:  02/04/2009  Job:  [id number]

## 2010-10-28 NOTE — H&P (Signed)
[place]  Patient:    [name], [name]                MRN: [id number] Adm. Date:  [id number] Attending:  Erich Montane CC:         Darius Bump, M.D.                         History and Physical  DATE OF BIRTH:  Jan 09, 1956  CHIEF COMPLAINT:  This is the first [facility] admission for this 55 year old, right-handed, white, married female from [place], [place] admitted from the emergency room for evaluation of speech disturbance.  HISTORY OF PRESENT ILLNESS:  This patient has a many-year history of high blood  pressure and elevated cholesterol.  She has been in good health most of her life. The evening of admission she was upstairs reading and felt very removed from herself.  Her speech became slurred.  She could read, but she could not understand what she was reading.  She began interposing letters in her words.  It was noted by her husband that the left side of her face was drawn or she was showing evidence of frustration.  The episode lasted about five minutes and was followed by left occipital headache without associated chest pain or palpitations.  She came to he emergency room.  She does have a known prior history of headaches and had a migraine headaches 15 years ago and has had some episodes of visual disturbance in the past.  She has a history of car sickness as a child.  She has no known cardiac disease, but does have a long history of hypertension.  She does not smoke cigarettes, and she does not take aspirin therapy.  She has no known history of  seizures, head or neck trauma.  Four weeks ago while visiting in [place], she had an episode when she stood up of back pain and chest pain, which resolved very quickly.  She otherwise has been in good health.  PAST MEDICAL HISTORY:  Significant for hypertension, attention deficit disorder, anxiety, depression, environmental allergies,  hysterectomy in February 2000, cholecystectomy in 1999.  She finished her sophomore year of college.  HABITS:  She does not smoke cigarettes, or drink alcohol.  ALLERGIES:  She has no known medicine allergies.  CURRENT MEDICATIONS:  1. Tricor 67 mg 2 q.d.  2. ___________ 10 mg b.i.d.  3. Effexor XR 150 mg b.i.d.  4. Depakote 500 mg t.i.d.  5. ___________ patch once per week.  6. Xanax 0.5 mg t.i.d.  7. Prinivil 10 mg q.d.  8. Clonidine 0.1 mg b.i.d.  ALLERGIES:  No known medical allergies.  FAMILY HISTORY:  Her mother died at 74 from Alzheimers disease.  Her father died at 72 from cancer.  She has one brother, 68, living and well.  There is a positive family history of high blood pressure, diabetes, and cancer.  PHYSICAL EXAMINATION:  GENERAL:  Well-developed, pleasant, white female.  VITAL SIGNS:  Blood pressure in right and left arm 120/80.  Heart rate 80.  HEENT:  No cranial, cervical, or orbital bruits were heard.  HEENT:  Tympanic membranes were clear.  Mouth was in good repair.  LUNGS:  Clear to auscultation.  HEART:  No murmurs.  ABDOMEN:  Bowel sounds normal.  There was no enlargement of liver, spleen, or kidneys.  EXTREMITIES:  There was no clubbing, cyanosis or edema in the  extremities.  NEUROLOGIC:  Mental status:  She was alert and oriented x 3.  She was not aphasic. Her cranial nerve examination revealed visual fields to be full.  The disks were flat with spontaneous venous pulsation seen.  The extraocular movements were full and corneals were present.  The facial sensation was equal.  There was no facial or motor asymmetry.  Hearing was present.  Air conduction greater than bone conduction.  Tongue was midline.  The uvula was midline.  Gags were present. Sternocleidomastoid and trapezius testing were normal.  Motor examination revealed good strength in the upper and lower extremities.  No drift.  She had an outstretched hand and arm tremor.   Sensory examination was intact to pinprick, touch, ___________ position, and vibration testing.  Deep tendon reflexes 1 to + and plantar responses were downgoing.  LABORATORY DATA:  CT scan without contrast showing no abnormalities.  Hemoglobin was 12.3, hematocrit 33.7, white blood cell count 7800, platelets 301,000.  There was 49% polys, 39% lymphs, 6% monocytes, 2% eosinophils, and 1%  basophils.  PTT was 29 seconds.  An INR was 1.2.  A 12-lead EKG was unremarkable.  Complete metabolic panel is still pending.  IMPRESSION:  1. Aphasia.  Code 784.3  2. Transient ischemic attack.  Code 435.9  3. Hypertension.  Code 796.2  4. Depression.  Code 311  5. Anxiety.  Code 300.00  6. Tremor.  Code 333.1  7. Attention deficit disorder.  Code 314.00  8. Obesity.  Code 278.01  PLAN:  Plan at this time is to admit her for Doppler study of the carotids, intracranial MR angiogram, MRI study.  She will receive one aspirin tonight and  then be placed on heparin per pharmacy protocol without a bolus.  She will be placed on telemetry. DD:  09/18/99 TD:  09/19/99 Job: [id number]

## 2010-10-28 NOTE — Op Note (Signed)
[place]  Patient:    [name], [name]                       MRN: [id number] Adm. Date:  12/19/00 Attending:  Ronaldo Miyamoto L. Franky Macho, M.D.                           Operative Report  PREOPERATIVE DIAGNOSIS:  Displaced disk L5-S1 right.  Right S1 radiculopathy. Degenerative disk disease L5-S1.  POSTOPERATIVE DIAGNOSIS:  Displaced disk L5-S1 right.  Right S1 radiculopathy. Degenerative disk disease L5-S1.  PROCEDURE:  Right L5-S1 semihemilaminectomy and diskectomy with microdissection.  COMPLICATIONS:  None.  SURGEON:  Kyle L. Franky Macho, M.D.  ANESTHESIA:  General endotracheal anesthesia.  INDICATIONS:  [name] is a 55 year old woman with a large herniated disk at L5-S1 on the right side.  She is having pain in her right S1 distribution. She has weakness in her gastrocnemius.  I have recommended after she had undergone thorough conservative treatment and was no longer interested in conservative measures, that she undergo lumbar laminectomy with diskectomy.  She agreed.  DESCRIPTION OF PROCEDURE:  [name] was brought to the operating room, intubated, and placed under general anesthesia without difficulty. She was rolled prone onto a Wilson frame and all pressure points were properly padded. The frame was elevated so that there was no abdominal pressure.  Her back was prepped.  I placed a needle for localization and an x-ray was taken.  Using that as a guide, I then infiltrated with 18 cc of 0.5% lidocaine 1:200,000 epinephrine at my proposed skin incision site and in her paraspinous musculature. The patient was then draped in a sterile fashion.  I opened the skin with a #10 blade and took this down to the thoracolumbar fascia.  Using monopolar cautery, I was able to then dissect to the thoracolumbar fascia.  I placed a self retaining cerebellar retractor and then exposed the lamina of L5 and S1.  I took another x-ray to localize L5 and S1 and I  was at the correct position.  I then placed a self retaining retractor and exposed the anterior laminar space between L5 and S1.  I removed the ligamentum flavum and also performed a very small amount of hemilaminectomy using Kerrison punches.  I removed ligament until the thecal sac was exposed.  At that point I was able to then retract the thecal sac and right S1 nerve root and there was a large hump there which was obviously the disk herniation.  Actually with just manipulation using the microdissectors, the disk material started to extrude on its own from the disk space.  I brought the microscope into position and using that to aid in microdissection, I was able to then remove large fragments of disk material without using the curet or any other scraping tool initially.  After the cavity seemed decompressed, I then used the curet to make sure that there were no loose bits that were remaining underneath the ligament or along the endplates.  Once I was done, I again used the pituitary rongeurs to remove disk material.  After performing an adequate decompression, I inspected the nerve root and felt that there was no compression in a rostrocaudal medial or lateral direction.  I then irrigated the wound, took the microscope out of the incision.  I then closed the wound with Vicryl sutures.  Reapproximated the thoracolumbar fascia first and then the subcutaneous  tissues.  Skin reapproximated with Dermabond.  The patient was then rolled supine, extubated, and was moving all extremities well postoperatively. DD:  12/19/00 TD:  12/19/00 Job: [id number]

## 2011-08-09 DIAGNOSIS — F39 Unspecified mood [affective] disorder: Secondary | ICD-10-CM | POA: Diagnosis not present

## 2011-08-23 DIAGNOSIS — N182 Chronic kidney disease, stage 2 (mild): Secondary | ICD-10-CM | POA: Diagnosis not present

## 2011-08-23 DIAGNOSIS — I10 Essential (primary) hypertension: Secondary | ICD-10-CM | POA: Diagnosis not present

## 2011-08-23 DIAGNOSIS — E1129 Type 2 diabetes mellitus with other diabetic kidney complication: Secondary | ICD-10-CM | POA: Diagnosis not present

## 2011-08-23 DIAGNOSIS — E782 Mixed hyperlipidemia: Secondary | ICD-10-CM | POA: Diagnosis not present

## 2011-11-07 DIAGNOSIS — F39 Unspecified mood [affective] disorder: Secondary | ICD-10-CM | POA: Diagnosis not present

## 2011-11-27 DIAGNOSIS — F22 Delusional disorders: Secondary | ICD-10-CM | POA: Diagnosis not present

## 2011-11-27 DIAGNOSIS — F39 Unspecified mood [affective] disorder: Secondary | ICD-10-CM | POA: Diagnosis not present

## 2011-12-18 DIAGNOSIS — F39 Unspecified mood [affective] disorder: Secondary | ICD-10-CM | POA: Diagnosis not present

## 2011-12-18 DIAGNOSIS — F22 Delusional disorders: Secondary | ICD-10-CM | POA: Diagnosis not present

## 2011-12-20 DIAGNOSIS — I10 Essential (primary) hypertension: Secondary | ICD-10-CM | POA: Diagnosis not present

## 2011-12-20 DIAGNOSIS — E782 Mixed hyperlipidemia: Secondary | ICD-10-CM | POA: Diagnosis not present

## 2012-01-01 DIAGNOSIS — F22 Delusional disorders: Secondary | ICD-10-CM | POA: Diagnosis not present

## 2012-01-01 DIAGNOSIS — F39 Unspecified mood [affective] disorder: Secondary | ICD-10-CM | POA: Diagnosis not present

## 2012-01-02 DIAGNOSIS — H9209 Otalgia, unspecified ear: Secondary | ICD-10-CM | POA: Diagnosis not present

## 2012-01-02 DIAGNOSIS — H60399 Other infective otitis externa, unspecified ear: Secondary | ICD-10-CM | POA: Diagnosis not present

## 2012-01-09 DIAGNOSIS — F39 Unspecified mood [affective] disorder: Secondary | ICD-10-CM | POA: Diagnosis not present

## 2012-01-09 DIAGNOSIS — F22 Delusional disorders: Secondary | ICD-10-CM | POA: Diagnosis not present

## 2012-01-29 DIAGNOSIS — F22 Delusional disorders: Secondary | ICD-10-CM | POA: Diagnosis not present

## 2012-01-29 DIAGNOSIS — F39 Unspecified mood [affective] disorder: Secondary | ICD-10-CM | POA: Diagnosis not present

## 2012-02-19 DIAGNOSIS — F39 Unspecified mood [affective] disorder: Secondary | ICD-10-CM | POA: Diagnosis not present

## 2012-02-19 DIAGNOSIS — F22 Delusional disorders: Secondary | ICD-10-CM | POA: Diagnosis not present

## 2012-03-18 DIAGNOSIS — F22 Delusional disorders: Secondary | ICD-10-CM | POA: Diagnosis not present

## 2012-03-18 DIAGNOSIS — F39 Unspecified mood [affective] disorder: Secondary | ICD-10-CM | POA: Diagnosis not present

## 2012-04-17 DIAGNOSIS — E663 Overweight: Secondary | ICD-10-CM | POA: Diagnosis not present

## 2012-04-17 DIAGNOSIS — I10 Essential (primary) hypertension: Secondary | ICD-10-CM | POA: Diagnosis not present

## 2012-04-17 DIAGNOSIS — E782 Mixed hyperlipidemia: Secondary | ICD-10-CM | POA: Diagnosis not present

## 2012-04-22 DIAGNOSIS — F39 Unspecified mood [affective] disorder: Secondary | ICD-10-CM | POA: Diagnosis not present

## 2012-05-20 DIAGNOSIS — M545 Low back pain: Secondary | ICD-10-CM | POA: Diagnosis not present

## 2012-07-10 ENCOUNTER — Emergency Department (HOSPITAL_COMMUNITY)
Admission: EM | Admit: 2012-07-10 | Discharge: 2012-07-10 | Disposition: A | Discharge status: Home / Self Care | Payer: Medicare Other | Attending: Emergency Medicine | Admitting: Emergency Medicine

## 2012-07-10 ENCOUNTER — Encounter (HOSPITAL_COMMUNITY): Payer: Self-pay | Admitting: Emergency Medicine

## 2012-07-10 ENCOUNTER — Emergency Department (HOSPITAL_COMMUNITY): Payer: Medicare Other

## 2012-07-10 DIAGNOSIS — Z7982 Long term (current) use of aspirin: Secondary | ICD-10-CM | POA: Insufficient documentation

## 2012-07-10 DIAGNOSIS — E119 Type 2 diabetes mellitus without complications: Secondary | ICD-10-CM | POA: Diagnosis not present

## 2012-07-10 DIAGNOSIS — IMO0002 Reserved for concepts with insufficient information to code with codable children: Secondary | ICD-10-CM | POA: Insufficient documentation

## 2012-07-10 DIAGNOSIS — E7800 Pure hypercholesterolemia, unspecified: Secondary | ICD-10-CM | POA: Diagnosis not present

## 2012-07-10 DIAGNOSIS — R29810 Facial weakness: Secondary | ICD-10-CM | POA: Diagnosis not present

## 2012-07-10 DIAGNOSIS — G510 Bell's palsy: Secondary | ICD-10-CM | POA: Insufficient documentation

## 2012-07-10 DIAGNOSIS — Z79899 Other long term (current) drug therapy: Secondary | ICD-10-CM | POA: Insufficient documentation

## 2012-07-10 DIAGNOSIS — F329 Major depressive disorder, single episode, unspecified: Secondary | ICD-10-CM | POA: Insufficient documentation

## 2012-07-10 DIAGNOSIS — R4789 Other speech disturbances: Secondary | ICD-10-CM | POA: Diagnosis not present

## 2012-07-10 DIAGNOSIS — H9209 Otalgia, unspecified ear: Secondary | ICD-10-CM | POA: Insufficient documentation

## 2012-07-10 DIAGNOSIS — I10 Essential (primary) hypertension: Secondary | ICD-10-CM | POA: Diagnosis not present

## 2012-07-10 DIAGNOSIS — F3289 Other specified depressive episodes: Secondary | ICD-10-CM | POA: Insufficient documentation

## 2012-07-10 HISTORY — DX: Depression, unspecified: F32.A

## 2012-07-10 HISTORY — DX: Essential (primary) hypertension: I10

## 2012-07-10 HISTORY — DX: Major depressive disorder, single episode, unspecified: F32.9

## 2012-07-10 HISTORY — DX: Pure hypercholesterolemia, unspecified: E78.00

## 2012-07-10 HISTORY — DX: Type 2 diabetes mellitus without complications: E11.9

## 2012-07-10 LAB — POCT I-STAT TROPONIN I: Troponin i, poc: 0 ng/mL (ref 0.00–0.08)

## 2012-07-10 LAB — CBC
MCV: 86.5 fL (ref 78.0–100.0)
Platelets: 244 10*3/uL (ref 150–400)
RBC: 3.71 MIL/uL — ABNORMAL LOW (ref 3.87–5.11)
RDW: 13.7 % (ref 11.5–15.5)
WBC: 7.7 10*3/uL (ref 4.0–10.5)

## 2012-07-10 LAB — COMPREHENSIVE METABOLIC PANEL
ALT: 19 U/L (ref 0–35)
AST: 18 U/L (ref 0–37)
Alkaline Phosphatase: 119 U/L — ABNORMAL HIGH (ref 39–117)
CO2: 22 mEq/L (ref 19–32)
Calcium: 9.4 mg/dL (ref 8.4–10.5)
GFR calc non Af Amer: >90 mL/min (ref >90)
Glucose, Bld: 348 mg/dL — ABNORMAL HIGH (ref 70–99)
Potassium: 3.9 mEq/L (ref 3.5–5.1)
Sodium: 130 mEq/L — ABNORMAL LOW (ref 135–145)
Total Protein: 7.1 g/dL (ref 6.0–8.3)

## 2012-07-10 LAB — DIFFERENTIAL
Basophils Absolute: 0 10*3/uL (ref 0.0–0.1)
Eosinophils Relative: 1 % (ref 0–5)
Lymphocytes Relative: 28 % (ref 12–46)
Lymphs Abs: 2.1 10*3/uL (ref 0.7–4.0)
Neutrophils Relative %: 65 % (ref 43–77)

## 2012-07-10 LAB — PROTIME-INR
INR: 0.91 (ref 0.00–1.49)
Prothrombin Time: 12.2 seconds (ref 11.6–15.2)

## 2012-07-10 LAB — APTT: aPTT: 32 seconds (ref 24–37)

## 2012-07-10 MED ORDER — PREDNISONE 10 MG PO TABS: 40.0000 mg | ORAL_TABLET | Freq: Every day | ORAL | Status: DC

## 2012-07-10 MED ORDER — POLYVINYL ALCOHOL 1.4 % OP SOLN: 1.0000 [drp] | OPHTHALMIC | Status: DC | PRN

## 2012-07-10 MED ORDER — PREDNISONE 20 MG PO TABS
60.0000 mg | ORAL_TABLET | Freq: Once | ORAL | Status: AC
  Administered 2012-07-10: 60 mg via ORAL
  Filled 2012-07-10: qty 3

## 2012-07-10 NOTE — ED Provider Notes (Signed)
History     CSN: [id number]  Arrival date & time 07/10/12  1640   First MD Initiated Contact with Patient 07/10/12 1930      Chief Complaint  Patient presents with  . Facial Droop    (Consider location/radiation/quality/duration/timing/severity/associated sxs/prior treatment) The history is provided by the patient and the spouse.  57 year old female with woke up yesterday with right facial droop and right ear pain. Right facial weakness worse today.  Denies any other weakness. NO history of similar symptoms. No current URI symptoms. No cold sores. Hx of DM.   Past Medical History  Diagnosis Date  . Diabetes mellitus without complication   . Hypertension   . High cholesterol   . Depression     Past Surgical History  Procedure Date  . Shoulder surgery   . Tonsillectomy   . Cholecystectomy   . Abdominal hysterectomy     No family history on file.  History  Substance Use Topics  . Smoking status: Never Smoker   . Smokeless tobacco: Not on file  . Alcohol Use: No    OB History    Grav Para Term Preterm Abortions TAB SAB Ect Mult Living                  Review of Systems  Constitutional: Negative for fever.  HENT: Positive for ear pain. Negative for hearing loss, congestion, mouth sores, trouble swallowing and neck pain.   Eyes: Negative for pain and visual disturbance.  Respiratory: Negative for shortness of breath.   Cardiovascular: Negative for chest pain.  Gastrointestinal: Negative for abdominal pain.  Genitourinary: Negative for dysuria.  Musculoskeletal: Negative for back pain.  Skin: Negative for rash.  Neurological: Positive for facial asymmetry and speech difficulty. Negative for headaches.  Hematological: Does not bruise/bleed easily.    Allergies  Review of patient's allergies indicates no known allergies.  Home Medications   Current Outpatient Rx  Name  Route  Sig  Dispense  Refill  . ACETAMINOPHEN 500 MG PO TABS   Oral   Take 1,000 mg by  mouth every 6 (six) hours as needed. For headache         . ASPIRIN EC 81 MG PO TBEC   Oral   Take 162 mg by mouth daily.         . BUPROPION HCL ER (XL) 150 MG PO TB24   Oral   Take 300 mg by mouth every evening.         Marland Kitchen CARBAMAZEPINE 200 MG PO TABS   Oral   Take 200 mg by mouth 2 (two) times daily.         Marland Kitchen DIPHENHYDRAMINE HCL 25 MG PO TABS   Oral   Take 12.5 mg by mouth at bedtime as needed. For sleep         . LORATADINE 10 MG PO TABS   Oral   Take 10 mg by mouth daily.         Marland Kitchen OMEPRAZOLE 20 MG PO CPDR   Oral   Take 20 mg by mouth 2 (two) times daily.         Marland Kitchen PAROXETINE HCL 40 MG PO TABS   Oral   Take 60 mg by mouth every morning.         Marland Kitchen SIMVASTATIN 20 MG PO TABS   Oral   Take 20 mg by mouth every evening.         Marland Kitchen SITAGLIPTIN-METFORMIN HCL 50-1000  MG PO TABS   Oral   Take 1 tablet by mouth 2 (two) times daily with a meal.         . TRAZODONE HCL 100 MG PO TABS   Oral   Take 200 mg by mouth at bedtime.         Marland Kitchen POLYVINYL ALCOHOL 1.4 % OP SOLN   Right Eye   Place 1 drop into the right eye as needed.   15 mL   0   . PREDNISONE 10 MG PO TABS   Oral   Take 4 tablets (40 mg total) by mouth daily.   20 tablet   0     BP 135/70  Pulse 92  Temp 97.4 F (36.3 C) (Oral)  Resp 16  SpO2 97%  Physical Exam  Nursing note and vitals reviewed. Constitutional: She is oriented to person, place, and time. She appears well-developed and well-nourished.  HENT:  Head: Normocephalic.  Right Ear: External ear normal.  Left Ear: External ear normal.  Mouth/Throat: Oropharynx is clear and moist.  Eyes: Conjunctivae normal and EOM are normal. Pupils are equal, round, and reactive to light.  Neck: Normal range of motion. Neck supple.  Cardiovascular: Normal rate, regular rhythm and normal heart sounds.   No murmur heard. Pulmonary/Chest: Effort normal and breath sounds normal.  Abdominal: Soft. Bowel sounds are normal. There is no  tenderness.  Musculoskeletal: Normal range of motion. She exhibits no edema.  Lymphadenopathy:    She has no cervical adenopathy.  Neurological: She is alert and oriented to person, place, and time. A cranial nerve deficit is present. She exhibits normal muscle tone. Coordination normal.       Right sided facial partial paralysis to include right forehead. Greater to right side of mouth. Able to close right eye. Rest neuro exam normal to include visual fields.Very mild speech abnormality no sig slurring or dysphagia.    Skin: Skin is warm. No rash noted. No erythema.    ED Course  Procedures (including critical care time)  Labs Reviewed  CBC - Abnormal; Notable for the following:    RBC 3.71 (*)     Hemoglobin 11.0 (*)     HCT 32.1 (*)     All other components within normal limits  COMPREHENSIVE METABOLIC PANEL - Abnormal; Notable for the following:    Sodium 130 (*)     Chloride 93 (*)     Glucose, Bld 348 (*)     Albumin 3.3 (*)     Alkaline Phosphatase 119 (*)     Total Bilirubin 0.2 (*)     All other components within normal limits  GLUCOSE, CAPILLARY - Abnormal; Notable for the following:    Glucose-Capillary 329 (*)     All other components within normal limits  GLUCOSE, CAPILLARY - Abnormal; Notable for the following:    Glucose-Capillary 259 (*)     All other components within normal limits  PROTIME-INR  APTT  DIFFERENTIAL  TROPONIN I  POCT I-STAT TROPONIN I   Ct Head Wo Contrast  07/10/2012  *RADIOLOGY REPORT*  Clinical Data: Right-sided facial droop of 2 days duration which is worsening.  CT HEAD WITHOUT CONTRAST  Technique:  Contiguous axial images were obtained from the base of the skull through the vertex without contrast.  Comparison: None.  Findings: There is mild generalized brain atrophy.  There is no evidence of old or acute focal infarction.  No mass lesion, hemorrhage, hydrocephalus or extra-axial collection.  The  calvarium is unremarkable.  Sinuses, middle  ears and mastoids are clear. There is atherosclerotic calcification of the major vessels at the base of the brain.  IMPRESSION: Atrophy without focal or acute finding.   Original Report Authenticated By: Paulina Fusi, M.D.    Results for orders placed during the hospital encounter of 07/10/12  PROTIME-INR      Component Value Range   Prothrombin Time 12.2  11.6 - 15.2 seconds   INR 0.91  0.00 - 1.49  APTT      Component Value Range   aPTT 32  24 - 37 seconds  CBC      Component Value Range   WBC 7.7  4.0 - 10.5 K/uL   RBC 3.71 (*) 3.87 - 5.11 MIL/uL   Hemoglobin 11.0 (*) 12.0 - 15.0 g/dL   HCT 78.2 (*) 95.6 - 21.3 %   MCV 86.5  78.0 - 100.0 fL   MCH 29.6  26.0 - 34.0 pg   MCHC 34.3  30.0 - 36.0 g/dL   RDW 08.6  57.8 - 46.9 %   Platelets 244  150 - 400 K/uL  DIFFERENTIAL      Component Value Range   Neutrophils Relative 65  43 - 77 %   Neutro Abs 5.0  1.7 - 7.7 K/uL   Lymphocytes Relative 28  12 - 46 %   Lymphs Abs 2.1  0.7 - 4.0 K/uL   Monocytes Relative 5  3 - 12 %   Monocytes Absolute 0.4  0.1 - 1.0 K/uL   Eosinophils Relative 1  0 - 5 %   Eosinophils Absolute 0.1  0.0 - 0.7 K/uL   Basophils Relative 0  0 - 1 %   Basophils Absolute 0.0  0.0 - 0.1 K/uL  COMPREHENSIVE METABOLIC PANEL      Component Value Range   Sodium 130 (*) 135 - 145 mEq/L   Potassium 3.9  3.5 - 5.1 mEq/L   Chloride 93 (*) 96 - 112 mEq/L   CO2 22  19 - 32 mEq/L   Glucose, Bld 348 (*) 70 - 99 mg/dL   BUN 11  6 - 23 mg/dL   Creatinine, Ser 6.29  0.50 - 1.10 mg/dL   Calcium 9.4  8.4 - 52.8 mg/dL   Total Protein 7.1  6.0 - 8.3 g/dL   Albumin 3.3 (*) 3.5 - 5.2 g/dL   AST 18  0 - 37 U/L   ALT 19  0 - 35 U/L   Alkaline Phosphatase 119 (*) 39 - 117 U/L   Total Bilirubin 0.2 (*) 0.3 - 1.2 mg/dL   GFR calc non Af Amer >90  >90 mL/min   GFR calc Af Amer >90  >90 mL/min  TROPONIN I      Component Value Range   Troponin I <0.30  <0.30 ng/mL  GLUCOSE, CAPILLARY      Component Value Range    Glucose-Capillary 329 (*) 70 - 99 mg/dL  POCT I-STAT TROPONIN I      Component Value Range   Troponin i, poc 0.00  0.00 - 0.08 ng/mL   Comment 3           GLUCOSE, CAPILLARY      Component Value Range   Glucose-Capillary 259 (*) 70 - 99 mg/dL     1. Bell's palsy       MDM   Patient with right-sided Bell's palsy. Partial paralysis of the right forehead but more complete to right side of mouth.  Patient able to close right eye. Spouse concurs that right forehead showing signs of weakness.  Head CT was negative, so CVA not likely since symptoms started yesterday.  Patient has followup with her primary care Dr. in the morning. Also given referral to neurology for followup as needed. Will be started on liquid tears and prednisone. No evidence of recent tick bite and is winter which would be unusual. Also not concerned about this being herpetic in nature. So not started on acyclovir.  Elevated blood sugar noted in patient with known diabetes, no acidosis, improved in ED to below 300 without treatment. No specific treatment indicated during this visit.        Shelda Jakes, MD 07/11/12 1425

## 2012-07-10 NOTE — ED Notes (Signed)
Pt st's she woke up yesterday with right facial droop.  St's today it seems to be worse.  Pt denies any other weakness. Pt alert and oriented x's 3.

## 2012-07-10 NOTE — ED Notes (Signed)
Pt given discharge paperwork; pt verbalized understanding of discharge and f/u; no additional questions by pt; e-signature obtained; 

## 2012-07-10 NOTE — ED Notes (Signed)
Pt transported to CT ?

## 2012-07-10 NOTE — ED Notes (Signed)
Pt denying any blurred vision or double vision, pain to cheeks; pt states drooling and facial paralysis started yesterday morning when she went to drink water;

## 2012-07-11 DIAGNOSIS — E1149 Type 2 diabetes mellitus with other diabetic neurological complication: Secondary | ICD-10-CM | POA: Diagnosis not present

## 2012-07-11 DIAGNOSIS — E1165 Type 2 diabetes mellitus with hyperglycemia: Secondary | ICD-10-CM | POA: Diagnosis not present

## 2012-07-11 DIAGNOSIS — N182 Chronic kidney disease, stage 2 (mild): Secondary | ICD-10-CM | POA: Diagnosis not present

## 2012-07-11 DIAGNOSIS — G609 Hereditary and idiopathic neuropathy, unspecified: Secondary | ICD-10-CM | POA: Diagnosis not present

## 2012-07-17 DIAGNOSIS — G510 Bell's palsy: Secondary | ICD-10-CM | POA: Diagnosis not present

## 2012-07-17 DIAGNOSIS — H9209 Otalgia, unspecified ear: Secondary | ICD-10-CM | POA: Diagnosis not present

## 2012-07-22 DIAGNOSIS — F39 Unspecified mood [affective] disorder: Secondary | ICD-10-CM | POA: Diagnosis not present

## 2012-09-09 DIAGNOSIS — E782 Mixed hyperlipidemia: Secondary | ICD-10-CM | POA: Diagnosis not present

## 2012-09-09 DIAGNOSIS — I10 Essential (primary) hypertension: Secondary | ICD-10-CM | POA: Diagnosis not present

## 2012-12-30 DIAGNOSIS — F39 Unspecified mood [affective] disorder: Secondary | ICD-10-CM | POA: Diagnosis not present

## 2013-01-13 DIAGNOSIS — E782 Mixed hyperlipidemia: Secondary | ICD-10-CM | POA: Diagnosis not present

## 2013-01-13 DIAGNOSIS — E669 Obesity, unspecified: Secondary | ICD-10-CM | POA: Diagnosis not present

## 2013-01-13 DIAGNOSIS — F329 Major depressive disorder, single episode, unspecified: Secondary | ICD-10-CM | POA: Diagnosis not present

## 2013-01-13 DIAGNOSIS — I10 Essential (primary) hypertension: Secondary | ICD-10-CM | POA: Diagnosis not present

## 2013-04-28 DIAGNOSIS — F39 Unspecified mood [affective] disorder: Secondary | ICD-10-CM | POA: Diagnosis not present

## 2013-04-28 DIAGNOSIS — M545 Low back pain: Secondary | ICD-10-CM | POA: Diagnosis not present

## 2013-07-15 ENCOUNTER — Encounter (HOSPITAL_COMMUNITY): Payer: Self-pay | Admitting: Emergency Medicine

## 2013-07-15 ENCOUNTER — Inpatient Hospital Stay (HOSPITAL_COMMUNITY)
Admission: EM | Admit: 2013-07-15 | Discharge: 2013-07-18 | DRG: 439 | Disposition: A | Discharge status: Home / Self Care | Payer: Medicare Other | Attending: Internal Medicine | Admitting: Internal Medicine

## 2013-07-15 ENCOUNTER — Inpatient Hospital Stay (HOSPITAL_COMMUNITY): Payer: Medicare Other

## 2013-07-15 DIAGNOSIS — F3289 Other specified depressive episodes: Secondary | ICD-10-CM | POA: Diagnosis not present

## 2013-07-15 DIAGNOSIS — K859 Acute pancreatitis without necrosis or infection, unspecified: Principal | ICD-10-CM | POA: Diagnosis present

## 2013-07-15 DIAGNOSIS — F319 Bipolar disorder, unspecified: Secondary | ICD-10-CM | POA: Diagnosis present

## 2013-07-15 DIAGNOSIS — F411 Generalized anxiety disorder: Secondary | ICD-10-CM | POA: Diagnosis present

## 2013-07-15 DIAGNOSIS — I10 Essential (primary) hypertension: Secondary | ICD-10-CM | POA: Diagnosis not present

## 2013-07-15 DIAGNOSIS — F329 Major depressive disorder, single episode, unspecified: Secondary | ICD-10-CM

## 2013-07-15 DIAGNOSIS — I498 Other specified cardiac arrhythmias: Secondary | ICD-10-CM | POA: Diagnosis present

## 2013-07-15 DIAGNOSIS — E119 Type 2 diabetes mellitus without complications: Secondary | ICD-10-CM | POA: Diagnosis present

## 2013-07-15 DIAGNOSIS — K7689 Other specified diseases of liver: Secondary | ICD-10-CM | POA: Diagnosis not present

## 2013-07-15 DIAGNOSIS — K219 Gastro-esophageal reflux disease without esophagitis: Secondary | ICD-10-CM | POA: Diagnosis present

## 2013-07-15 DIAGNOSIS — D649 Anemia, unspecified: Secondary | ICD-10-CM | POA: Diagnosis not present

## 2013-07-15 DIAGNOSIS — R0602 Shortness of breath: Secondary | ICD-10-CM | POA: Diagnosis not present

## 2013-07-15 DIAGNOSIS — R079 Chest pain, unspecified: Secondary | ICD-10-CM | POA: Diagnosis not present

## 2013-07-15 DIAGNOSIS — Z91199 Patient's noncompliance with other medical treatment and regimen due to unspecified reason: Secondary | ICD-10-CM

## 2013-07-15 DIAGNOSIS — E871 Hypo-osmolality and hyponatremia: Secondary | ICD-10-CM | POA: Diagnosis present

## 2013-07-15 DIAGNOSIS — E781 Pure hyperglyceridemia: Secondary | ICD-10-CM

## 2013-07-15 DIAGNOSIS — Z9119 Patient's noncompliance with other medical treatment and regimen: Secondary | ICD-10-CM | POA: Diagnosis not present

## 2013-07-15 DIAGNOSIS — Z7982 Long term (current) use of aspirin: Secondary | ICD-10-CM

## 2013-07-15 DIAGNOSIS — E785 Hyperlipidemia, unspecified: Secondary | ICD-10-CM | POA: Diagnosis present

## 2013-07-15 DIAGNOSIS — E7800 Pure hypercholesterolemia, unspecified: Secondary | ICD-10-CM | POA: Diagnosis present

## 2013-07-15 DIAGNOSIS — F32A Depression, unspecified: Secondary | ICD-10-CM | POA: Diagnosis present

## 2013-07-15 HISTORY — DX: Gastro-esophageal reflux disease without esophagitis: K21.9

## 2013-07-15 LAB — ABO/RH: ABO/RH(D): O POS

## 2013-07-15 LAB — URINALYSIS W MICROSCOPIC + REFLEX CULTURE
Bilirubin Urine: NEGATIVE
Glucose, UA: >1000 mg/dL — AB
HGB URINE DIPSTICK: NEGATIVE
Ketones, ur: NEGATIVE mg/dL
Leukocytes, UA: NEGATIVE
Nitrite: NEGATIVE
PH: 5 (ref 5.0–8.0)
Protein, ur: NEGATIVE mg/dL
SPECIFIC GRAVITY, URINE: 1.018 (ref 1.005–1.030)
UROBILINOGEN UA: 0.2 mg/dL (ref 0.0–1.0)

## 2013-07-15 LAB — COMPREHENSIVE METABOLIC PANEL
ALBUMIN: 2.8 g/dL — AB (ref 3.5–5.2)
ALT: 15 U/L (ref 0–35)
AST: 12 U/L (ref 0–37)
Alkaline Phosphatase: 136 U/L — ABNORMAL HIGH (ref 39–117)
BUN: 16 mg/dL (ref 6–23)
CALCIUM: 8.6 mg/dL (ref 8.4–10.5)
CO2: 22 mEq/L (ref 19–32)
CREATININE: 0.76 mg/dL (ref 0.50–1.10)
Chloride: 89 mEq/L — ABNORMAL LOW (ref 96–112)
GFR calc Af Amer: >90 mL/min (ref >90)
GFR calc non Af Amer: >90 mL/min (ref >90)
Glucose, Bld: 337 mg/dL — ABNORMAL HIGH (ref 70–99)
Potassium: 4.1 mEq/L (ref 3.7–5.3)
Sodium: 125 mEq/L — ABNORMAL LOW (ref 137–147)
Total Protein: 5.9 g/dL — ABNORMAL LOW (ref 6.0–8.3)

## 2013-07-15 LAB — FERRITIN: Ferritin: 98 ng/mL (ref 10–291)

## 2013-07-15 LAB — CBC WITH DIFFERENTIAL/PLATELET
BASOS PCT: 0 % (ref 0–1)
Basophils Absolute: 0 10*3/uL (ref 0.0–0.1)
EOS PCT: 3 % (ref 0–5)
Eosinophils Absolute: 0.2 10*3/uL (ref 0.0–0.7)
HCT: 22.1 % — ABNORMAL LOW (ref 36.0–46.0)
Hemoglobin: 7.7 g/dL — ABNORMAL LOW (ref 12.0–15.0)
LYMPHS ABS: 1.3 10*3/uL (ref 0.7–4.0)
Lymphocytes Relative: 20 % (ref 12–46)
MCH: 29.4 pg (ref 26.0–34.0)
MCHC: 34.8 g/dL (ref 30.0–36.0)
MCV: 84.4 fL (ref 78.0–100.0)
MONO ABS: 0.5 10*3/uL (ref 0.1–1.0)
MONOS PCT: 7 % (ref 3–12)
NEUTROS ABS: 4.7 10*3/uL (ref 1.7–7.7)
NEUTROS PCT: 71 % (ref 43–77)
PLATELETS: 189 10*3/uL (ref 150–400)
RBC: 2.62 MIL/uL — ABNORMAL LOW (ref 3.87–5.11)
RDW: 14.2 % (ref 11.5–15.5)
WBC: 6.7 10*3/uL (ref 4.0–10.5)

## 2013-07-15 LAB — CBC
HCT: 31.5 % — ABNORMAL LOW (ref 36.0–46.0)
Hemoglobin: 11.1 g/dL — ABNORMAL LOW (ref 12.0–15.0)
MCH: 29.5 pg (ref 26.0–34.0)
MCHC: 35.2 g/dL (ref 30.0–36.0)
MCV: 83.8 fL (ref 78.0–100.0)
PLATELETS: 211 10*3/uL (ref 150–400)
RBC: 3.76 MIL/uL — AB (ref 3.87–5.11)
RDW: 14.2 % (ref 11.5–15.5)
WBC: 9.3 10*3/uL (ref 4.0–10.5)

## 2013-07-15 LAB — TYPE AND SCREEN
ABO/RH(D): O POS
ANTIBODY SCREEN: NEGATIVE

## 2013-07-15 LAB — FOLATE

## 2013-07-15 LAB — POCT I-STAT TROPONIN I: Troponin i, poc: 0.01 ng/mL (ref 0.00–0.08)

## 2013-07-15 LAB — SODIUM, URINE, RANDOM: Sodium, Ur: 116 mEq/L

## 2013-07-15 LAB — GLUCOSE, CAPILLARY
GLUCOSE-CAPILLARY: 277 mg/dL — AB (ref 70–99)
Glucose-Capillary: 342 mg/dL — ABNORMAL HIGH (ref 70–99)
Glucose-Capillary: 297 mg/dL — ABNORMAL HIGH (ref 70–99)

## 2013-07-15 LAB — RETICULOCYTES
RBC.: 3.76 MIL/uL — ABNORMAL LOW (ref 3.87–5.11)
RETIC CT PCT: 2 % (ref 0.4–3.1)
Retic Count, Absolute: 75.2 10*3/uL (ref 19.0–186.0)

## 2013-07-15 LAB — CREATININE, SERUM
Creatinine, Ser: 0.62 mg/dL (ref 0.50–1.10)
GFR calc Af Amer: >90 mL/min (ref >90)
GFR calc non Af Amer: >90 mL/min (ref >90)

## 2013-07-15 LAB — IRON AND TIBC
Iron: <10 ug/dL — ABNORMAL LOW (ref 42–135)
UIBC: 204 ug/dL (ref 125–400)

## 2013-07-15 LAB — CREATININE, URINE, RANDOM: Creatinine, Urine: 30.34 mg/dL

## 2013-07-15 LAB — OSMOLALITY: OSMOLALITY: 295 mosm/kg (ref 275–300)

## 2013-07-15 LAB — VITAMIN B12: VITAMIN B 12: 313 pg/mL (ref 211–911)

## 2013-07-15 LAB — HEMOGLOBIN A1C
Hgb A1c MFr Bld: 12.5 % — ABNORMAL HIGH (ref <5.7)
Mean Plasma Glucose: 312 mg/dL — ABNORMAL HIGH (ref <117)

## 2013-07-15 LAB — MRSA PCR SCREENING: MRSA by PCR: NEGATIVE

## 2013-07-15 LAB — OCCULT BLOOD, POC DEVICE: FECAL OCCULT BLD: NEGATIVE

## 2013-07-15 LAB — OSMOLALITY, URINE: OSMOLALITY UR: 521 mosm/kg (ref 390–1090)

## 2013-07-15 LAB — LIPASE, BLOOD

## 2013-07-15 MED ORDER — INSULIN ASPART 100 UNIT/ML ~~LOC~~ SOLN
0.0000 [IU] | SUBCUTANEOUS | Status: DC
  Administered 2013-07-15 (×2): 8 [IU] via SUBCUTANEOUS
  Administered 2013-07-15: 11 [IU] via SUBCUTANEOUS
  Administered 2013-07-16: 5 [IU] via SUBCUTANEOUS
  Administered 2013-07-16: 3 [IU] via SUBCUTANEOUS
  Administered 2013-07-16 (×4): 5 [IU] via SUBCUTANEOUS
  Administered 2013-07-17 (×2): 3 [IU] via SUBCUTANEOUS
  Administered 2013-07-17: 2 [IU] via SUBCUTANEOUS
  Administered 2013-07-17 (×2): 3 [IU] via SUBCUTANEOUS
  Administered 2013-07-17 – 2013-07-18 (×2): 5 [IU] via SUBCUTANEOUS
  Administered 2013-07-18 (×3): 2 [IU] via SUBCUTANEOUS

## 2013-07-15 MED ORDER — SODIUM CHLORIDE 0.9 % IV SOLN: INTRAVENOUS | Status: AC

## 2013-07-15 MED ORDER — MORPHINE SULFATE 2 MG/ML IJ SOLN
2.0000 mg | INTRAMUSCULAR | Status: DC | PRN
  Administered 2013-07-15 (×3): 2 mg via INTRAVENOUS
  Filled 2013-07-15 (×3): qty 1

## 2013-07-15 MED ORDER — ONDANSETRON HCL 4 MG/2ML IJ SOLN
4.0000 mg | Freq: Once | INTRAMUSCULAR | Status: AC
  Administered 2013-07-15: 4 mg via INTRAVENOUS
  Filled 2013-07-15: qty 2

## 2013-07-15 MED ORDER — HEPARIN SODIUM (PORCINE) 5000 UNIT/ML IJ SOLN
5000.0000 [IU] | Freq: Three times a day (TID) | INTRAMUSCULAR | Status: DC
  Administered 2013-07-15 – 2013-07-18 (×9): 5000 [IU] via SUBCUTANEOUS
  Filled 2013-07-15 (×12): qty 1

## 2013-07-15 MED ORDER — LORAZEPAM 1 MG PO TABS
0.5000 mg | ORAL_TABLET | Freq: Every day | ORAL | Status: DC | PRN
  Administered 2013-07-16: 1 mg via ORAL
  Filled 2013-07-15: qty 1

## 2013-07-15 MED ORDER — CARBAMAZEPINE 200 MG PO TABS
200.0000 mg | ORAL_TABLET | Freq: Two times a day (BID) | ORAL | Status: DC
Start: 2013-07-15 — End: 2013-07-18
  Administered 2013-07-15 – 2013-07-18 (×7): 200 mg via ORAL
  Filled 2013-07-15 (×8): qty 1

## 2013-07-15 MED ORDER — ASPIRIN EC 81 MG PO TBEC
162.0000 mg | DELAYED_RELEASE_TABLET | Freq: Every day | ORAL | Status: DC
  Administered 2013-07-15 – 2013-07-18 (×4): 162 mg via ORAL
  Filled 2013-07-15 (×4): qty 2

## 2013-07-15 MED ORDER — SODIUM CHLORIDE 0.9 % IV BOLUS (SEPSIS)
1000.0000 mL | Freq: Once | INTRAVENOUS | Status: AC
  Administered 2013-07-15: 1000 mL via INTRAVENOUS

## 2013-07-15 MED ORDER — PAROXETINE HCL 30 MG PO TABS
60.0000 mg | ORAL_TABLET | ORAL | Status: DC
  Administered 2013-07-15 – 2013-07-17 (×3): 60 mg via ORAL
  Filled 2013-07-15 (×4): qty 2

## 2013-07-15 MED ORDER — POLYETHYLENE GLYCOL 3350 17 G PO PACK
17.0000 g | PACK | Freq: Every day | ORAL | Status: DC | PRN
  Filled 2013-07-15: qty 1

## 2013-07-15 MED ORDER — ONDANSETRON HCL 4 MG PO TABS: 4.0000 mg | ORAL_TABLET | Freq: Four times a day (QID) | ORAL | Status: DC | PRN

## 2013-07-15 MED ORDER — MORPHINE SULFATE 4 MG/ML IJ SOLN
4.0000 mg | Freq: Once | INTRAMUSCULAR | Status: AC
  Administered 2013-07-15: 4 mg via INTRAVENOUS
  Filled 2013-07-15: qty 1

## 2013-07-15 MED ORDER — HYDRALAZINE HCL 20 MG/ML IJ SOLN: 10.0000 mg | Freq: Four times a day (QID) | INTRAMUSCULAR | Status: DC | PRN

## 2013-07-15 MED ORDER — GUAIFENESIN-DM 100-10 MG/5ML PO SYRP
5.0000 mL | ORAL_SOLUTION | ORAL | Status: DC | PRN
  Filled 2013-07-15: qty 5

## 2013-07-15 MED ORDER — SODIUM CHLORIDE 0.9 % IV SOLN
INTRAVENOUS | Status: AC
  Administered 2013-07-15: 12:00:00 via INTRAVENOUS
  Administered 2013-07-15 – 2013-07-16 (×2): 125 mL/h via INTRAVENOUS

## 2013-07-15 MED ORDER — SODIUM CHLORIDE 0.9 % IJ SOLN
3.0000 mL | Freq: Two times a day (BID) | INTRAMUSCULAR | Status: DC
  Administered 2013-07-16 – 2013-07-17 (×2): 3 mL via INTRAVENOUS

## 2013-07-15 MED ORDER — GI COCKTAIL ~~LOC~~: 30.0000 mL | Freq: Once | ORAL | Status: DC

## 2013-07-15 MED ORDER — PANTOPRAZOLE SODIUM 40 MG IV SOLR
40.0000 mg | Freq: Two times a day (BID) | INTRAVENOUS | Status: DC
  Administered 2013-07-15 – 2013-07-17 (×6): 40 mg via INTRAVENOUS
  Filled 2013-07-15 (×8): qty 40

## 2013-07-15 MED ORDER — HYDROCODONE-ACETAMINOPHEN 5-325 MG PO TABS
1.0000 | ORAL_TABLET | ORAL | Status: DC | PRN
  Administered 2013-07-15 – 2013-07-16 (×4): 2 via ORAL
  Administered 2013-07-17: 1 via ORAL
  Filled 2013-07-15 (×3): qty 2
  Filled 2013-07-15: qty 1
  Filled 2013-07-15: qty 2

## 2013-07-15 MED ORDER — ONDANSETRON HCL 4 MG/2ML IJ SOLN
4.0000 mg | Freq: Four times a day (QID) | INTRAMUSCULAR | Status: DC | PRN
  Administered 2013-07-15: 4 mg via INTRAVENOUS
  Filled 2013-07-15: qty 2

## 2013-07-15 MED ORDER — BUPROPION HCL ER (XL) 300 MG PO TB24
300.0000 mg | ORAL_TABLET | Freq: Every evening | ORAL | Status: DC
  Administered 2013-07-15 – 2013-07-16 (×2): 300 mg via ORAL
  Filled 2013-07-15 (×3): qty 1

## 2013-07-15 NOTE — H&P (Signed)
Patient Demographics  [name], is a 58 y.o. female  MRN: [id number]   DOB - [birth year]  Admit Date - 07/15/2013  Outpatient Primary MD for the patient is No PCP Per Patient   With History of -  Past Medical History  Diagnosis Date  . Diabetes mellitus without complication   . Hypertension   . High cholesterol   . Depression   . GERD (gastroesophageal reflux disease)       Past Surgical History  Procedure Laterality Date  . Shoulder surgery    . Tonsillectomy    . Cholecystectomy    . Abdominal hysterectomy      in for   Chief Complaint  Patient presents with  . Abdominal Pain     HPI  [name]  is a 58 y.o. female, with history of depression, diabetes mellitus type 2, dyslipidemia, GERD who was in her usual state of health until about 3 AM this morning when she woke up with excruciating epigastric abdominal pain which was sharp in nature with some radiation to the back and associated with nausea, no aggravating relieving factors, patient thought that she was having a heart attack came to the ER where her workup was suggestive of acute pancreatitis with elevated lipase, she's had cholecystectomy in the past, no history of previous pancreatitis, does have dyslipidemia, no alcohol use.   In the ER she was treated with supportive care for acute pancreatitis with good effect, her pain is much improved she appears comfortable. Denies any headache, no chest pain or shortness of breath, no palpitations, no vomiting, no diarrhea, no blood or mucus in stool. No focal weakness.    Review of Systems    In addition to the HPI above,  No Fever-chills, No Headache, No changes with Vision or hearing, No problems swallowing food or Liquids, No Chest pain, Cough or Shortness of Breath, Was at the  Abdominal pain, currently no Nausea or Vommitting, Bowel movements are regular, No Blood in stool or Urine, No dysuria, No new skin rashes or bruises, No new joints pains-aches,  No new weakness, tingling, numbness in any extremity, No recent weight gain or loss, No polyuria, polydypsia or polyphagia, No significant Mental Stressors.  A full 10 point Review of Systems was done, except as stated above, all other Review of Systems were negative.   Social History History  Substance Use Topics  . Smoking status: Never Smoker   . Smokeless tobacco: Not on file  . Alcohol Use: No      Family History No history of pancreatitis or pancreatic cancer  Prior to Admission medications   Medication Sig Start Date End Date Taking? Authorizing Provider  acetaminophen (TYLENOL) 500 MG tablet Take 1,000 mg by mouth every 6 (six) hours as needed. For headache   Yes Historical Provider, MD  aspirin EC 81 MG tablet Take 162 mg by mouth daily.   Yes Historical Provider, MD  buPROPion (WELLBUTRIN XL)  150 MG 24 hr tablet Take 300 mg by mouth every evening.   Yes Historical Provider, MD  carbamazepine (TEGRETOL) 200 MG tablet Take 200 mg by mouth 2 (two) times daily.   Yes Historical Provider, MD  cetirizine (ZYRTEC) 10 MG tablet Take 10 mg by mouth daily.   Yes Historical Provider, MD  diphenhydrAMINE (BENADRYL) 25 MG tablet Take 12.5 mg by mouth at bedtime as needed. For sleep   Yes Historical Provider, MD  glimepiride (AMARYL) 4 MG tablet Take 4 mg by mouth daily with breakfast.   Yes Historical Provider, MD  lisinopril (PRINIVIL,ZESTRIL) 20 MG tablet Take 20 mg by mouth daily.   Yes Historical Provider, MD  LORazepam (ATIVAN) 0.5 MG tablet Take 0.5-1 mg by mouth daily as needed for anxiety.   Yes Historical Provider, MD  omeprazole (PRILOSEC OTC) 20 MG tablet Take 20 mg by mouth daily.   Yes Historical Provider, MD  PARoxetine (PAXIL) 40 MG tablet Take 60 mg by mouth every morning.   Yes Historical  Provider, MD  sitaGLIPtin (JANUVIA) 100 MG tablet Take 100 mg by mouth daily.   Yes Historical Provider, MD    No Known Allergies  Physical Exam  Vitals  Blood pressure 180/70, pulse 100, temperature 98.6 F (37 C), temperature source Oral, resp. rate 14, height 5\' 4"  (1.626 m), weight 78.926 kg (174 lb), SpO2 99.00%.   1. General middle-aged Caucasian female lying in bed in NAD,     2. Normal affect and insight, Not Suicidal or Homicidal, Awake Alert, Oriented X 3.  3. No F.N deficits, ALL C.Nerves Intact, Strength 5/5 all 4 extremities, Sensation intact all 4 extremities, Plantars down going.  4. Ears and Eyes appear Normal, Conjunctivae clear, PERRLA. Moist Oral Mucosa.  5. Supple Neck, No JVD, No cervical lymphadenopathy appriciated, No Carotid Bruits.  6. Symmetrical Chest wall movement, Good air movement bilaterally, CTAB.  7. RRR, No Gallops, Rubs or Murmurs, No Parasternal Heave.  8. Positive Bowel Sounds, Abdomen Soft, mild epigastric tenderness, No organomegaly appriciated,No rebound -guarding or rigidity.  9.  No Cyanosis, Normal Skin Turgor, No Skin Rash or Bruise.  10. Good muscle tone,  joints appear normal , no effusions, Normal ROM.  11. No Palpable Lymph Nodes in Neck or Axillae     Data Review  CBC  Recent Labs Lab 07/15/13 0620  WBC 6.7  HGB 7.7*  HCT 22.1*  PLT 189  MCV 84.4  MCH 29.4  MCHC 34.8  RDW 14.2  LYMPHSABS 1.3  MONOABS 0.5  EOSABS 0.2  BASOSABS 0.0   ------------------------------------------------------------------------------------------------------------------  Chemistries   Recent Labs Lab 07/15/13 0620  NA 125*  K 4.1  CL 89*  CO2 22  GLUCOSE 337*  BUN 16  CREATININE 0.76  CALCIUM 8.6  AST 12  ALT 15  ALKPHOS 136*  BILITOT <0.2*   ------------------------------------------------------------------------------------------------------------------ estimated creatinine clearance is 77.9 ml/min (by C-G formula  based on Cr of 0.76). ------------------------------------------------------------------------------------------------------------------ No results found for this basename: TSH, T4TOTAL, FREET3, T3FREE, THYROIDAB,  in the last 72 hours   Coagulation profile No results found for this basename: INR, PROTIME,  in the last 168 hours ------------------------------------------------------------------------------------------------------------------- No results found for this basename: DDIMER,  in the last 72 hours -------------------------------------------------------------------------------------------------------------------  Cardiac Enzymes No results found for this basename: CK, CKMB, TROPONINI, MYOGLOBIN,  in the last 168 hours ------------------------------------------------------------------------------------------------------------------ No components found with this basename: POCBNP,    ---------------------------------------------------------------------------------------------------------------  Urinalysis    Component Value Date/Time   COLORURINE YELLOW  01/01/2009 0431   APPEARANCEUR CLEAR 01/01/2009 0431   LABSPEC 1.006 01/01/2009 0431   PHURINE 5.0 01/01/2009 0431   GLUCOSEU NEGATIVE 01/01/2009 0431   HGBUR NEGATIVE 01/01/2009 0431   BILIRUBINUR NEGATIVE 01/01/2009 0431   KETONESUR NEGATIVE 01/01/2009 0431   PROTEINUR NEGATIVE 01/01/2009 0431   UROBILINOGEN 0.2 01/01/2009 0431   NITRITE NEGATIVE 01/01/2009 0431   LEUKOCYTESUR NEGATIVE MICROSCOPIC NOT DONE ON URINES WITH NEGATIVE PROTEIN, BLOOD, LEUKOCYTES, NITRITE, OR GLUCOSE <1000 mg/dL. 01/01/2009 0431    ----------------------------------------------------------------------------------------------------------------  Imaging results:   No results found.  My personal review of EKG: Rhythm NSR, Rate  94 /min, no Acute ST changes    Assessment & Plan   1. Acute pancreatitis. Etiology unclear. She has had cholecystectomy in  the past, no history of alcohol abuse, does have dyslipidemia. Will be admitted to step down overnight, bowel rest with IV fluids, check lipid panel and right upper quadrant ultrasound to evaluate CBD. Repeat CMP and lipase in the morning. Pain and nausea control. We'll consult pharmacy to evaluate if any medications could potentially cause acute pancreatitis.    2. Depression. Home depression medications and anxiety medications will be continued.    3. DM type II. Check A1c, will be placed on every 4 sliding scale. Oral hypoglycemics will be held.    4. Hypertension. Will treat with pain control, IV hydralazine as needed.    5. Mild hyponatremia due to #1 above, aggressive hydration, check urine electrolytes, repeat CMP in the morning.     6. Anemia. No history of GI bleed, no blood in stool or black tarry stool, Hemoccult negative in the ER per ER M.D. We'll type and screen, check anemia panel, repeat H&H in the morning, IV Protonix for now.       DVT Prophylaxis Heparin    AM Labs Ordered, also please review Full Orders  Family Communication: Admission, patients condition and plan of care including tests being ordered have been discussed with the patient and husband who indicate understanding and agree with the plan and Code Status.  Code Status full  Likely DC to  home  Condition GUARDED   Time spent in minutes : 35    Nikkita Adeyemi K M.D on 07/15/2013 at 9:11 AM  Between 7am to 7pm - Pager - [phone]  After 7pm go to [email or web address] - password [email or web address]  And look for the night coverage person covering me after hours  [facility] Office  [phone]

## 2013-07-15 NOTE — Progress Notes (Signed)
Utilization Review Completed.[name] T2/08/2013  

## 2013-07-15 NOTE — Progress Notes (Signed)
MEDICATION RELATED CONSULT NOTE - INITIAL   Pharmacy Consult for medication review of medications for drug-induced pancreatitis   No Known Allergies  Patient Measurements: Height: 5\' 4"  (162.6 cm) Weight: 174 lb (78.926 kg) IBW/kg (Calculated) : 54.7   Vital Signs: Temp: 98.6 F (37 C) (02/03 0606) Temp src: Oral (02/03 0606) BP: 143/67 mmHg (02/03 0845) Pulse Rate: 96 (02/03 0915) Intake/Output from previous day:   Intake/Output from this shift:    Labs:  Recent Labs  07/15/13 0620  WBC 6.7  HGB 7.7*  HCT 22.1*  PLT 189  CREATININE 0.76  ALBUMIN 2.8*  PROT 5.9*  AST 12  ALT 15  ALKPHOS 136*  BILITOT <0.2*   Estimated Creatinine Clearance: 77.9 ml/min (by C-G formula based on Cr of 0.76).   Microbiology: No results found for this or any previous visit (from the past 720 hour(s)).  Medical History: Past Medical History  Diagnosis Date  . Diabetes mellitus without complication   . Hypertension   . High cholesterol   . Depression   . GERD (gastroesophageal reflux disease)     Pharmacy consulted to review home medications and check if any can cause acute pancreatitis.  1. Acetaminophen, implicated in > 10 but < 20 cases. May be overdose-related. 2. Aspirin< 10 cases reported 3. buproprion  < 10 cases reported 4. Carbamazepine > 10 but < 20 cases 5. Cetirizine - none 6. Diphenhydramine -none 7. Glimepride, none but glyburide has > 20 cases reported 8. Lisinopril - enalapril has 10-20 cases. Onset of pancreatitis f/u ace-I may be several hours or several months after initiation of tx 9. Lorazepam - none 10. Omeprazole < 10 cases 11. Sitagliptin > 20 cases. DC if pancreatitis is suspected. Do not restart if pancreatitis confirmed.  Summary: big players are carbamazepine, lisinopril and sitiagliptin and acetaminophen.   References: J Clin Gastroenterol. 2005 Sep;20(8)" 709-16/ Pharmacist's Letter/Prescriber's Letter April 2013: Drug- Induced  Pancreatitis Herby AbrahamMichelle T. Johnie Stadel, Pharm.D. [phone] 07/15/2013 10:06 AM

## 2013-07-15 NOTE — ED Provider Notes (Signed)
CSN: [id number]     Arrival date & time 07/15/13  0528 History   First MD Initiated Contact with Patient 07/15/13 [phone]     Chief Complaint  Patient presents with  . Abdominal Pain   (Consider location/radiation/quality/duration/timing/severity/associated sxs/prior Treatment) HPI Comments: Patient is a 58 year old female with a past medical history of diabetes, hypertension, and GERD who presents with abdominal pain since last night. EMS brought the patient to the ED from home. The pain is located in her epigastrium and does not radiate. The pain is described as aching and severe. The pain started suddenly and woke her from sleep and progressively worsened since the onset. No alleviating/aggravating factors. The patient has tried nitro for symptoms without relief. Associated symptoms include nausea. Patient denies fever, headache, vomiting, diarrhea, chest pain, SOB, dysuria, constipation, abnormal vaginal bleeding/discharge. Patient reports a history of abdominal hysterectomy and cholecystectomy. Patient reports the pain feels like GERD but much worse.     Patient is a 58 y.o. female presenting with abdominal pain.  Abdominal Pain Associated symptoms: nausea   Associated symptoms: no chest pain, no chills, no diarrhea, no dysuria, no fatigue, no fever, no shortness of breath and no vomiting     Past Medical History  Diagnosis Date  . Diabetes mellitus without complication   . Hypertension   . High cholesterol   . Depression   . GERD (gastroesophageal reflux disease)    Past Surgical History  Procedure Laterality Date  . Shoulder surgery    . Tonsillectomy    . Cholecystectomy    . Abdominal hysterectomy     History reviewed. No pertinent family history. History  Substance Use Topics  . Smoking status: Never Smoker   . Smokeless tobacco: Not on file  . Alcohol Use: No   OB History   Grav Para Term Preterm Abortions TAB SAB Ect Mult Living                 Review of Systems   Constitutional: Negative for fever, chills and fatigue.  HENT: Negative for trouble swallowing.   Eyes: Negative for visual disturbance.  Respiratory: Negative for shortness of breath.   Cardiovascular: Negative for chest pain and palpitations.  Gastrointestinal: Positive for nausea and abdominal pain. Negative for vomiting and diarrhea.  Genitourinary: Negative for dysuria and difficulty urinating.  Musculoskeletal: Negative for arthralgias and neck pain.  Skin: Negative for color change.  Neurological: Negative for dizziness and weakness.  Psychiatric/Behavioral: Negative for dysphoric mood.    Allergies  Review of patient's allergies indicates no known allergies.  Home Medications   Current Outpatient Rx  Name  Route  Sig  Dispense  Refill  . acetaminophen (TYLENOL) 500 MG tablet   Oral   Take 1,000 mg by mouth every 6 (six) hours as needed. For headache         . aspirin EC 81 MG tablet   Oral   Take 162 mg by mouth daily.         Marland Kitchen. buPROPion (WELLBUTRIN XL) 150 MG 24 hr tablet   Oral   Take 300 mg by mouth every evening.         . carbamazepine (TEGRETOL) 200 MG tablet   Oral   Take 200 mg by mouth 2 (two) times daily.         . cetirizine (ZYRTEC) 10 MG tablet   Oral   Take 10 mg by mouth daily.         .Marland Kitchen  diphenhydrAMINE (BENADRYL) 25 MG tablet   Oral   Take 12.5 mg by mouth at bedtime as needed. For sleep         . glimepiride (AMARYL) 4 MG tablet   Oral   Take 4 mg by mouth daily with breakfast.         . lisinopril (PRINIVIL,ZESTRIL) 20 MG tablet   Oral   Take 20 mg by mouth daily.         Marland Kitchen LORazepam (ATIVAN) 0.5 MG tablet   Oral   Take 0.5-1 mg by mouth daily as needed for anxiety.         Marland Kitchen omeprazole (PRILOSEC OTC) 20 MG tablet   Oral   Take 20 mg by mouth daily.         Marland Kitchen PARoxetine (PAXIL) 40 MG tablet   Oral   Take 60 mg by mouth every morning.         . sitaGLIPtin (JANUVIA) 100 MG tablet   Oral   Take 100 mg  by mouth daily.          BP 144/77  Pulse 99  Temp(Src) 98.6 F (37 C) (Oral)  Resp 14  Ht 5\' 4"  (1.626 m)  Wt 174 lb (78.926 kg)  BMI 29.85 kg/m2  SpO2 97% Physical Exam  Nursing note and vitals reviewed. Constitutional: She is oriented to person, place, and time. She appears well-developed and well-nourished. No distress.  HENT:  Head: Normocephalic and atraumatic.  Eyes: Conjunctivae and EOM are normal. Pupils are equal, round, and reactive to light.  Neck: Normal range of motion.  Cardiovascular: Normal rate and regular rhythm.  Exam reveals no gallop and no friction rub.   No murmur heard. Pulmonary/Chest: Effort normal and breath sounds normal. She has no wheezes. She has no rales. She exhibits no tenderness.  Abdominal: Soft. She exhibits no distension. There is tenderness. There is no rebound and no guarding.  Epigastric tenderness to palpation. No other focal tenderness or peritoneal signs.   Musculoskeletal: Normal range of motion.  Neurological: She is alert and oriented to person, place, and time. Coordination normal.  Speech is goal-oriented. Moves limbs without ataxia.   Skin: Skin is warm and dry.  Psychiatric: She has a normal mood and affect. Her behavior is normal.    ED Course  Procedures (including critical care time) Labs Review Labs Reviewed  CBC WITH DIFFERENTIAL - Abnormal; Notable for the following:    RBC 2.62 (*)    Hemoglobin 7.7 (*)    HCT 22.1 (*)    All other components within normal limits  COMPREHENSIVE METABOLIC PANEL - Abnormal; Notable for the following:    Sodium 125 (*)    Chloride 89 (*)    Glucose, Bld 337 (*)    Total Protein 5.9 (*)    Albumin 2.8 (*)    Alkaline Phosphatase 136 (*)    Total Bilirubin <0.2 (*)    All other components within normal limits  LIPASE, BLOOD - Abnormal; Notable for the following:    Lipase >3000 (*)    All other components within normal limits  IRON AND TIBC - Abnormal; Notable for the  following:    Iron <10 (*)    All other components within normal limits  RETICULOCYTES - Abnormal; Notable for the following:    RBC. 3.76 (*)    All other components within normal limits  CBC - Abnormal; Notable for the following:    RBC 3.76 (*)  Hemoglobin 11.1 (*)    HCT 31.5 (*)    All other components within normal limits  URINALYSIS W MICROSCOPIC + REFLEX CULTURE - Abnormal; Notable for the following:    Glucose, UA >1000 (*)    All other components within normal limits  GLUCOSE, CAPILLARY - Abnormal; Notable for the following:    Glucose-Capillary 342 (*)    All other components within normal limits  MRSA PCR SCREENING  CREATININE, SERUM  CREATININE, URINE, RANDOM  SODIUM, URINE, RANDOM  VITAMIN B12  FOLATE  FERRITIN  HEMOGLOBIN A1C  OSMOLALITY, URINE  OSMOLALITY  OCCULT BLOOD, POC DEVICE  POCT I-STAT TROPONIN I  TYPE AND SCREEN  ABO/RH   Imaging Review No results found.  EKG Interpretation    Date/Time:  Tuesday July 15 2013 06:11:53 EST Ventricular Rate:  94 PR Interval:  154 QRS Duration: 81 QT Interval:  356 QTC Calculation: 445 R Axis:   61 Text Interpretation:  Sinus rhythm Baseline wander in lead(s) II No significant change since last tracing Confirmed by [facility]  MD, MARTHA [phone]) on 07/15/2013 7:20:30 AM            MDM   1. Pancreatitis   2. Anemia   3. Diabetes mellitus without complication   4. Hypertension   5. High cholesterol   6. Depression   7. GERD (gastroesophageal reflux disease)     8:13 AM Patient's labs show elevated lipase >3000. Patient also has hyperglycemia at 337 with some associated hyponatremia. Patient has pancreatitis of unknown etiology. Vitals stable and patient afebrile. Patient will have fluids, morphine and zofran.     Emilia Beck, PA-C 07/15/13 1535

## 2013-07-15 NOTE — ED Notes (Signed)
Per EMS pt awaken with CP and Abdominal pain; nausea but denies vomiting. Pt received 325 mg Asprin and nitro x 1 with improvement from pain level 10 down to 6. Sob resolved with O2. Pt has hx of anxiety and GERD. C/o of tenderness in upper rt qud.

## 2013-07-15 NOTE — ED Notes (Signed)
PA at bedside.

## 2013-07-16 ENCOUNTER — Inpatient Hospital Stay (HOSPITAL_COMMUNITY): Payer: Medicare Other

## 2013-07-16 DIAGNOSIS — K859 Acute pancreatitis without necrosis or infection, unspecified: Secondary | ICD-10-CM | POA: Diagnosis not present

## 2013-07-16 DIAGNOSIS — I10 Essential (primary) hypertension: Secondary | ICD-10-CM | POA: Diagnosis not present

## 2013-07-16 DIAGNOSIS — F329 Major depressive disorder, single episode, unspecified: Secondary | ICD-10-CM | POA: Diagnosis not present

## 2013-07-16 DIAGNOSIS — E119 Type 2 diabetes mellitus without complications: Secondary | ICD-10-CM | POA: Diagnosis not present

## 2013-07-16 LAB — GLUCOSE, CAPILLARY
GLUCOSE-CAPILLARY: 239 mg/dL — AB (ref 70–99)
GLUCOSE-CAPILLARY: 236 mg/dL — AB (ref 70–99)
Glucose-Capillary: 189 mg/dL — ABNORMAL HIGH (ref 70–99)
Glucose-Capillary: 201 mg/dL — ABNORMAL HIGH (ref 70–99)
Glucose-Capillary: 231 mg/dL — ABNORMAL HIGH (ref 70–99)
Glucose-Capillary: 250 mg/dL — ABNORMAL HIGH (ref 70–99)

## 2013-07-16 LAB — CBC
HEMATOCRIT: 33.7 % — AB (ref 36.0–46.0)
HEMATOCRIT: 32.4 % — AB (ref 36.0–46.0)
HEMOGLOBIN: 11.3 g/dL — AB (ref 12.0–15.0)
Hemoglobin: 11.8 g/dL — ABNORMAL LOW (ref 12.0–15.0)
MCH: 30.1 pg (ref 26.0–34.0)
MCH: 30.1 pg (ref 26.0–34.0)
MCHC: 35 g/dL (ref 30.0–36.0)
MCHC: 34.9 g/dL (ref 30.0–36.0)
MCV: 86 fL (ref 78.0–100.0)
MCV: 86.4 fL (ref 78.0–100.0)
Platelets: 224 10*3/uL (ref 150–400)
Platelets: 205 10*3/uL (ref 150–400)
RBC: 3.92 MIL/uL (ref 3.87–5.11)
RBC: 3.75 MIL/uL — AB (ref 3.87–5.11)
RDW: 14.8 % (ref 11.5–15.5)
RDW: 14.6 % (ref 11.5–15.5)
WBC: 12.3 10*3/uL — ABNORMAL HIGH (ref 4.0–10.5)
WBC: 12.7 10*3/uL — AB (ref 4.0–10.5)

## 2013-07-16 LAB — COMPREHENSIVE METABOLIC PANEL
ALBUMIN: 2.5 g/dL — AB (ref 3.5–5.2)
ALT: 12 U/L (ref 0–35)
AST: 17 U/L (ref 0–37)
Alkaline Phosphatase: 106 U/L (ref 39–117)
BUN: 8 mg/dL (ref 6–23)
CO2: 23 mEq/L (ref 19–32)
CREATININE: 0.62 mg/dL (ref 0.50–1.10)
Calcium: 7.6 mg/dL — ABNORMAL LOW (ref 8.4–10.5)
Chloride: 97 mEq/L (ref 96–112)
GFR calc Af Amer: >90 mL/min (ref >90)
GFR calc non Af Amer: >90 mL/min (ref >90)
Glucose, Bld: 215 mg/dL — ABNORMAL HIGH (ref 70–99)
Potassium: 4.2 mEq/L (ref 3.7–5.3)
Sodium: 132 mEq/L — ABNORMAL LOW (ref 137–147)
TOTAL PROTEIN: 6 g/dL (ref 6.0–8.3)
Total Bilirubin: 0.2 mg/dL — ABNORMAL LOW (ref 0.3–1.2)

## 2013-07-16 LAB — LIPASE, BLOOD: Lipase: 724 U/L — ABNORMAL HIGH (ref 11–59)

## 2013-07-16 LAB — LIPID PANEL
Cholesterol: 754 mg/dL — ABNORMAL HIGH (ref 0–200)
LDL Cholesterol: UNDETERMINED mg/dL (ref 0–99)
Triglycerides: 3262 mg/dL — ABNORMAL HIGH (ref <150)
VLDL: UNDETERMINED mg/dL (ref 0–40)

## 2013-07-16 MED ORDER — IOHEXOL 300 MG/ML  SOLN
80.0000 mL | Freq: Once | INTRAMUSCULAR | Status: AC | PRN
  Administered 2013-07-16: 80 mL via INTRAVENOUS

## 2013-07-16 MED ORDER — IOHEXOL 300 MG/ML  SOLN
25.0000 mL | INTRAMUSCULAR | Status: AC
  Administered 2013-07-16 (×2): 25 mL via ORAL

## 2013-07-16 MED ORDER — SODIUM CHLORIDE 0.9 % IV SOLN
INTRAVENOUS | Status: DC
  Administered 2013-07-16: 22:00:00 via INTRAVENOUS
  Administered 2013-07-16: 125 mL/h via INTRAVENOUS
  Administered 2013-07-17 (×2): via INTRAVENOUS

## 2013-07-16 NOTE — Progress Notes (Signed)
Inpatient Diabetes Program Recommendations  AACE/ADA: New Consensus Statement on Inpatient Glycemic Control (2013)  Target Ranges:  Prepandial:   less than 140 mg/dL      Peak postprandial:   less than 180 mg/dL (1-2 hours)      Critically ill patients:  140 - 180 mg/dL   Consistent Hyperglycemia in 200  Current orders for Inpatient glycemic control: Moderate correction q 4 hrs  Inpatient Diabetes Program Recommendations Correction (SSI): Increase correction to q 4hrs and/or add Lantus 15 units daily or HS Thank you, Lenor CoffinAnn Allayna Erlich, RN, CNS, Diabetes Coordinator [phone])

## 2013-07-16 NOTE — ED Provider Notes (Signed)
Medical screening examination/treatment/procedure(s) were performed by non-physician practitioner and as supervising physician I was immediately available for consultation/collaboration.      Charles B. Bernette MayersSheldon, MD 07/16/13 [phone]

## 2013-07-16 NOTE — Progress Notes (Signed)
Subjective: Admission history and physical reviewed, patient admitted with pancreatitis. She does not have a gallbladder. She does not ingest alcohol. Her triglycerides are very high, she's on Januvia which potentially can cause pancreatitis. She still has some abdominal pain, abdominal ultrasound limited due to body habitus. Initial labs showed anemia followup testing showed normal hemoglobin.  Objective: Vital signs in last 24 hours: Temp:  [98 F (36.7 C)-100.4 F (38 C)] 99.4 F (37.4 C) (02/04 0751) Pulse Rate:  [95-135] 135 (02/04 0751) Resp:  [12-32] 12 (02/04 0751) BP: (106-161)/(69-88) 126/85 mmHg (02/04 0751) SpO2:  [93 %-98 %] 98 % (02/04 0751) Weight:  [86.8 kg (191 lb 5.8 oz)] 86.8 kg (191 lb 5.8 oz) (02/03 1017) Weight change: 7.874 kg (17 lb 5.8 oz)    Intake/Output from previous day: 02/03 0701 - 02/04 0700 In: 868.8 [I.V.:868.8] Out: 550 [Urine:550] Intake/Output this shift: Total I/O In: 453.8 [P.O.:360; I.V.:93.8] Out: 800 [Urine:800]  General appearance: alert and cooperative Resp: clear to auscultation bilaterally Cardio: sinus tachycardia GI: soft, some guarding, no ecchymosis, unable to appreciate any organomegaly Extremities: extremities normal, atraumatic, no cyanosis or edema  Lab Results:  Results for orders placed during the hospital encounter of 07/15/13 (from the past 24 hour(s))  VITAMIN B12     Status: None   Collection Time    07/15/13 10:45 AM      Result Value Range   Vitamin B-12 313  211 - 911 pg/mL  FOLATE     Status: None   Collection Time    07/15/13 10:45 AM      Result Value Range   Folate >20.0    IRON AND TIBC     Status: Abnormal   Collection Time    07/15/13 10:45 AM      Result Value Range   Iron <10 (*) 42 - 135 ug/dL   TIBC Not calculated due to Iron <10.  250 - 470 ug/dL   Saturation Ratios Not calculated due to Iron <10.  20 - 55 %   UIBC 204  125 - 400 ug/dL  FERRITIN     Status: None   Collection Time   07/15/13 10:45 AM      Result Value Range   Ferritin 98  10 - 291 ng/mL  RETICULOCYTES     Status: Abnormal   Collection Time    07/15/13 10:45 AM      Result Value Range   Retic Ct Pct 2.0  0.4 - 3.1 %   RBC. 3.76 (*) 3.87 - 5.11 MIL/uL   Retic Count, Manual 75.2  19.0 - 186.0 K/uL  TYPE AND SCREEN     Status: None   Collection Time    07/15/13 10:45 AM      Result Value Range   ABO/RH(D) O POS     Antibody Screen NEG     Sample Expiration 07/18/2013    CBC     Status: Abnormal   Collection Time    07/15/13 10:45 AM      Result Value Range   WBC 9.3  4.0 - 10.5 K/uL   RBC 3.76 (*) 3.87 - 5.11 MIL/uL   Hemoglobin 11.1 (*) 12.0 - 15.0 g/dL   HCT 30.831.5 (*) 65.736.0 - 84.646.0 %   MCV 83.8  78.0 - 100.0 fL   MCH 29.5  26.0 - 34.0 pg   MCHC 35.2  30.0 - 36.0 g/dL   RDW 96.214.2  95.211.5 - 84.115.5 %   Platelets 211  150 - 400 K/uL  CREATININE, SERUM     Status: None   Collection Time    07/15/13 10:45 AM      Result Value Range   Creatinine, Ser 0.62  0.50 - 1.10 mg/dL   GFR calc non Af Amer >90  >90 mL/min   GFR calc Af Amer >90  >90 mL/min  HEMOGLOBIN A1C     Status: Abnormal   Collection Time    07/15/13 10:45 AM      Result Value Range   Hemoglobin A1C 12.5 (*) <5.7 %   Mean Plasma Glucose 312 (*) <117 mg/dL  OSMOLALITY     Status: None   Collection Time    07/15/13 10:45 AM      Result Value Range   Osmolality 295  275 - 300 mOsm/kg  ABO/RH     Status: None   Collection Time    07/15/13 10:45 AM      Result Value Range   ABO/RH(D) O POS    GLUCOSE, CAPILLARY     Status: Abnormal   Collection Time    07/15/13 11:51 AM      Result Value Range   Glucose-Capillary 342 (*) 70 - 99 mg/dL   Comment 1 Notify RN    MRSA PCR SCREENING     Status: None   Collection Time    07/15/13 12:59 PM      Result Value Range   MRSA by PCR NEGATIVE  NEGATIVE  CREATININE, URINE, RANDOM     Status: None   Collection Time    07/15/13  1:16 PM      Result Value Range   Creatinine, Urine 30.34     URINALYSIS W MICROSCOPIC + REFLEX CULTURE     Status: Abnormal   Collection Time    07/15/13  1:16 PM      Result Value Range   Color, Urine YELLOW  YELLOW   APPearance CLEAR  CLEAR   Specific Gravity, Urine 1.018  1.005 - 1.030   pH 5.0  5.0 - 8.0   Glucose, UA >1000 (*) NEGATIVE mg/dL   Hgb urine dipstick NEGATIVE  NEGATIVE   Bilirubin Urine NEGATIVE  NEGATIVE   Ketones, ur NEGATIVE  NEGATIVE mg/dL   Protein, ur NEGATIVE  NEGATIVE mg/dL   Urobilinogen, UA 0.2  0.0 - 1.0 mg/dL   Nitrite NEGATIVE  NEGATIVE   Leukocytes, UA NEGATIVE  NEGATIVE  SODIUM, URINE, RANDOM     Status: None   Collection Time    07/15/13  1:16 PM      Result Value Range   Sodium, Ur 116    OSMOLALITY, URINE     Status: None   Collection Time    07/15/13  1:16 PM      Result Value Range   Osmolality, Ur 521  390 - 1090 mOsm/kg  GLUCOSE, CAPILLARY     Status: Abnormal   Collection Time    07/15/13  5:06 PM      Result Value Range   Glucose-Capillary 277 (*) 70 - 99 mg/dL  GLUCOSE, CAPILLARY     Status: Abnormal   Collection Time    07/15/13  8:09 PM      Result Value Range   Glucose-Capillary 297 (*) 70 - 99 mg/dL   Comment 1 Notify RN    GLUCOSE, CAPILLARY     Status: Abnormal   Collection Time    07/16/13 12:12 AM      Result Value Range  Glucose-Capillary 250 (*) 70 - 99 mg/dL   Comment 1 Notify RN    CBC     Status: Abnormal   Collection Time    07/16/13  3:40 AM      Result Value Range   WBC 12.3 (*) 4.0 - 10.5 K/uL   RBC 3.92  3.87 - 5.11 MIL/uL   Hemoglobin 11.8 (*) 12.0 - 15.0 g/dL   HCT 16.1 (*) 09.6 - 04.5 %   MCV 86.0  78.0 - 100.0 fL   MCH 30.1  26.0 - 34.0 pg   MCHC 35.0  30.0 - 36.0 g/dL   RDW 40.9  81.1 - 91.4 %   Platelets 224  150 - 400 K/uL  COMPREHENSIVE METABOLIC PANEL     Status: Abnormal   Collection Time    07/16/13  3:40 AM      Result Value Range   Sodium 132 (*) 137 - 147 mEq/L   Potassium 4.2  3.7 - 5.3 mEq/L   Chloride 97  96 - 112 mEq/L   CO2 23  19 -  32 mEq/L   Glucose, Bld 215 (*) 70 - 99 mg/dL   BUN 8  6 - 23 mg/dL   Creatinine, Ser 7.82  0.50 - 1.10 mg/dL   Calcium 7.6 (*) 8.4 - 10.5 mg/dL   Total Protein 6.0  6.0 - 8.3 g/dL   Albumin 2.5 (*) 3.5 - 5.2 g/dL   AST 17  0 - 37 U/L   ALT 12  0 - 35 U/L   Alkaline Phosphatase 106  39 - 117 U/L   Total Bilirubin 0.2 (*) 0.3 - 1.2 mg/dL   GFR calc non Af Amer >90  >90 mL/min   GFR calc Af Amer >90  >90 mL/min  LIPID PANEL     Status: Abnormal   Collection Time    07/16/13  3:40 AM      Result Value Range   Cholesterol 754 (*) 0 - 200 mg/dL   Triglycerides 9562 (*) <150 mg/dL   HDL NOT REPORTED DUE TO HIGH TRIGLYCERIDES  >39 mg/dL   Total CHOL/HDL Ratio NOT REPORTED DUE TO HIGH TRIGLYCERIDES     VLDL UNABLE TO CALCULATE IF TRIGLYCERIDE OVER 400 mg/dL  0 - 40 mg/dL   LDL Cholesterol UNABLE TO CALCULATE IF TRIGLYCERIDE OVER 400 mg/dL  0 - 99 mg/dL  GLUCOSE, CAPILLARY     Status: Abnormal   Collection Time    07/16/13  5:40 AM      Result Value Range   Glucose-Capillary 231 (*) 70 - 99 mg/dL   Comment 1 Notify RN    GLUCOSE, CAPILLARY     Status: Abnormal   Collection Time    07/16/13  7:54 AM      Result Value Range   Glucose-Capillary 239 (*) 70 - 99 mg/dL      Studies/Results: US Abdomen Complete  07/15/2013   CLINICAL DATA:  Pancreatitis. History of cholecystectomy, hypertension and diabetes.  EXAM: ULTRASOUND ABDOMEN COMPLETE  COMPARISON:  CT ABD W/CM dated 12/01/2005  FINDINGS: Gallbladder:  Surgically absent.  Common bile duct:  Diameter: 3.9 mm.  No evidence of choledocholithiasis.  Liver:  There is diffusely increased hepatic echogenicity with decreased penetration, consistent with steatosis. No focal lesions are identified.  IVC:  Largely obscured by bowel gas.  No demonstrated abnormality.  Pancreas:  Largely obscured by bowel gas.  No demonstrated abnormality.  Spleen:  Size and appearance within normal limits.  Right Kidney:  Length: 11.7 cm. Echogenicity within normal  limits. No mass or hydronephrosis visualized.  Left Kidney:  Length: 12.3 cm. Echogenicity within normal limits. No mass or hydronephrosis visualized.  Abdominal aorta:  Partially obscured by bowel gas.  No aneurysm visualized.  Other findings:  None.  IMPRESSION: 1. Examination is limited by body habitus and bowel gas. 2. Hepatic steatosis. No biliary dilatation following cholecystectomy. 3. No acute findings demonstrated. Portions of the aorta, IVC and pancreas are obscured by bowel gas.   Electronically Signed   By: Roxy Horseman M.D.   On: 07/15/2013 16:57    Medications:  Prior to Admission:  Prescriptions prior to admission  Medication Sig Dispense Refill  . acetaminophen (TYLENOL) 500 MG tablet Take 1,000 mg by mouth every 6 (six) hours as needed. For headache      . aspirin EC 81 MG tablet Take 162 mg by mouth daily.      Marland Kitchen buPROPion (WELLBUTRIN XL) 150 MG 24 hr tablet Take 300 mg by mouth every evening.      . carbamazepine (TEGRETOL) 200 MG tablet Take 200 mg by mouth 2 (two) times daily.      . cetirizine (ZYRTEC) 10 MG tablet Take 10 mg by mouth daily.      . diphenhydrAMINE (BENADRYL) 25 MG tablet Take 12.5 mg by mouth at bedtime as needed. For sleep      . glimepiride (AMARYL) 4 MG tablet Take 4 mg by mouth daily with breakfast.      . lisinopril (PRINIVIL,ZESTRIL) 20 MG tablet Take 20 mg by mouth daily.      Marland Kitchen LORazepam (ATIVAN) 0.5 MG tablet Take 0.5-1 mg by mouth daily as needed for anxiety.      Marland Kitchen omeprazole (PRILOSEC OTC) 20 MG tablet Take 20 mg by mouth daily.      Marland Kitchen PARoxetine (PAXIL) 40 MG tablet Take 60 mg by mouth every morning.      . sitaGLIPtin (JANUVIA) 100 MG tablet Take 100 mg by mouth daily.       Scheduled: . aspirin EC  162 mg Oral Daily  . buPROPion  300 mg Oral QPM  . carbamazepine  200 mg Oral BID  . gi cocktail  30 mL Oral Once  . heparin  5,000 Units Subcutaneous Q8H  . insulin aspart  0-15 Units Subcutaneous Q4H  . pantoprazole (PROTONIX) IV  40 mg  Intravenous Q12H  . PARoxetine  60 mg Oral [id number]  . sodium chloride  3 mL Intravenous Q12H   Continuous: . sodium chloride 125 mL/hr (07/16/13 0815)   ZOX:[id number], hydrALAZINE, HYDROcodone-acetaminophen, LORazepam, morphine injection, ondansetron (ZOFRAN) IV, ondansetron, polyethylene glycol  Assessment/Plan: Pancreatitis uncertain etiology at this time however patient's triglycerides are markedly elevated. She is on medication Januvia which according to PDR can be associated with pancreatitis ( pharmacy input appreciated). We'll continue IV fluids, bowel rest, check followup labs. CT may be indicated. As well as GI input  Abnormal CBC, check followup CBC to see which is correct  Diabetes historically poor control due to noncompliance, continue sliding scale insulin and Lantus  History of bipolar   LOS: 1 day   Edi Gorniak D 07/16/2013, 9:46 AM

## 2013-07-17 DIAGNOSIS — K859 Acute pancreatitis without necrosis or infection, unspecified: Secondary | ICD-10-CM | POA: Diagnosis not present

## 2013-07-17 DIAGNOSIS — I10 Essential (primary) hypertension: Secondary | ICD-10-CM | POA: Diagnosis not present

## 2013-07-17 DIAGNOSIS — E119 Type 2 diabetes mellitus without complications: Secondary | ICD-10-CM | POA: Diagnosis not present

## 2013-07-17 DIAGNOSIS — F329 Major depressive disorder, single episode, unspecified: Secondary | ICD-10-CM | POA: Diagnosis not present

## 2013-07-17 LAB — COMPREHENSIVE METABOLIC PANEL
ALK PHOS: 97 U/L (ref 39–117)
ALT: 10 U/L (ref 0–35)
AST: 12 U/L (ref 0–37)
Albumin: 2.4 g/dL — ABNORMAL LOW (ref 3.5–5.2)
BILIRUBIN TOTAL: 0.2 mg/dL — AB (ref 0.3–1.2)
BUN: 5 mg/dL — ABNORMAL LOW (ref 6–23)
CHLORIDE: 103 meq/L (ref 96–112)
CO2: 25 meq/L (ref 19–32)
Calcium: 8.1 mg/dL — ABNORMAL LOW (ref 8.4–10.5)
Creatinine, Ser: 0.71 mg/dL (ref 0.50–1.10)
GFR calc Af Amer: >90 mL/min (ref >90)
Glucose, Bld: 160 mg/dL — ABNORMAL HIGH (ref 70–99)
POTASSIUM: 4.1 meq/L (ref 3.7–5.3)
SODIUM: 139 meq/L (ref 137–147)
Total Protein: 6.1 g/dL (ref 6.0–8.3)

## 2013-07-17 LAB — GLUCOSE, CAPILLARY
GLUCOSE-CAPILLARY: 144 mg/dL — AB (ref 70–99)
GLUCOSE-CAPILLARY: 199 mg/dL — AB (ref 70–99)
Glucose-Capillary: 150 mg/dL — ABNORMAL HIGH (ref 70–99)
Glucose-Capillary: 151 mg/dL — ABNORMAL HIGH (ref 70–99)
Glucose-Capillary: 161 mg/dL — ABNORMAL HIGH (ref 70–99)
Glucose-Capillary: 200 mg/dL — ABNORMAL HIGH (ref 70–99)
Glucose-Capillary: 220 mg/dL — ABNORMAL HIGH (ref 70–99)

## 2013-07-17 MED ORDER — BUPROPION HCL ER (XL) 300 MG PO TB24
300.0000 mg | ORAL_TABLET | Freq: Every day | ORAL | Status: DC
  Administered 2013-07-18: 300 mg via ORAL
  Filled 2013-07-17: qty 1

## 2013-07-17 MED ORDER — PAROXETINE HCL 30 MG PO TABS
60.0000 mg | ORAL_TABLET | Freq: Every day | ORAL | Status: DC
  Filled 2013-07-17: qty 2

## 2013-07-17 MED ORDER — INSULIN PEN STARTER KIT
1.0000 | Freq: Once | Status: DC
  Filled 2013-07-17 (×2): qty 1

## 2013-07-17 MED ORDER — INSULIN GLARGINE 100 UNIT/ML ~~LOC~~ SOLN
12.0000 [IU] | Freq: Every day | SUBCUTANEOUS | Status: DC
  Administered 2013-07-17 – 2013-07-18 (×2): 12 [IU] via SUBCUTANEOUS
  Filled 2013-07-17 (×2): qty 0.12

## 2013-07-17 NOTE — Progress Notes (Signed)
Wendy MercuryCynthia K [name] is a 58 y.o. female patient who transferred  from 64M awake, alert  & orientated  X 3, Full Code, VSS - Blood pressure 164/80, pulse 102, temperature 98.9 F (37.2 C), temperature source Oral, resp. rate 20, height 5\' 4"  (1.626 m), weight 84.5 kg (186 lb 4.6 oz), SpO2 97.00%., R/A, no c/o shortness of breath, no c/o chest pain, no distress noted. Tele # [id number] placed and pt is currently running:sinus tachycardia.   IV site WDL: forearm left, condition patent and no redness with a transparent dsg that's clean dry and intact.  Allergies:  No Known Allergies   Past Medical History  Diagnosis Date  . Diabetes mellitus without complication   . Hypertension   . High cholesterol   . Depression   . GERD (gastroesophageal reflux disease)     Pt orientation to unit, room and routine. SR up x 2, fall risk assessment complete with Patient and family verbalizing understanding of risks associated with falls. Pt verbalizes an understanding of how to use the call bell and to call for help before getting out of bed.  Skin, clean-dry- intact without evidence of bruising, or skin tears.   No evidence of skin break down noted on exam.     Will cont to monitor and assist as needed.  Joana ReamerJohnson, Wells Gerdeman C, RN 07/17/2013 6:16 PM

## 2013-07-17 NOTE — Progress Notes (Signed)
Subjective: No problems overnight, patient still with some pain that she states is better, she states she had a bowel movement. CT confirms pancreatitis, no pseudocysts or other pathology. Followup CBC showed stable hemoglobin.  Objective: Vital signs in last 24 hours: Temp:  [98.2 F (36.8 C)-102.5 F (39.2 C)] 99 F (37.2 C) (02/05 0834) Pulse Rate:  [112-125] 113 (02/05 0834) Resp:  [18-27] 21 (02/05 0834) BP: (124-167)/(61-80) 140/69 mmHg (02/05 0834) SpO2:  [93 %-99 %] 96 % (02/05 0834) Weight:  [84.5 kg (186 lb 4.6 oz)] 84.5 kg (186 lb 4.6 oz) (02/05 0431) Weight change: -2.3 kg (-5 lb 1.1 oz) Last BM Date: 07/16/13  Intake/Output from previous day: 02/04 0701 - 02/05 0700 In: 3753.8 [P.O.:560; I.V.:2593.8] Out: 1550 [Urine:1550] Intake/Output this shift: Total I/O In: 250 [I.V.:250] Out: -   General appearance: alert, cooperative and slightly anxious Resp: clear to auscultation bilaterally Cardio: slightly tachycardic but regular GI: soft, good bowel sounds, there is some guarding. No ecchymosis in the flank Extremities: extremities normal, atraumatic, no cyanosis or edema  Lab Results:  Results for orders placed during the hospital encounter of 07/15/13 (from the past 24 hour(s))  CBC     Status: Abnormal   Collection Time    07/16/13 11:10 AM      Result Value Range   WBC 12.7 (*) 4.0 - 10.5 K/uL   RBC 3.75 (*) 3.87 - 5.11 MIL/uL   Hemoglobin 11.3 (*) 12.0 - 15.0 g/dL   HCT 91.4 (*) 78.2 - 95.6 %   MCV 86.4  78.0 - 100.0 fL   MCH 30.1  26.0 - 34.0 pg   MCHC 34.9  30.0 - 36.0 g/dL   RDW 21.3  08.6 - 57.8 %   Platelets 205  150 - 400 K/uL  LIPASE, BLOOD     Status: Abnormal   Collection Time    07/16/13 11:10 AM      Result Value Range   Lipase 724 (*) 11 - 59 U/L  GLUCOSE, CAPILLARY     Status: Abnormal   Collection Time    07/16/13 12:36 PM      Result Value Range   Glucose-Capillary 236 (*) 70 - 99 mg/dL   Comment 1 Notify RN    GLUCOSE, CAPILLARY      Status: Abnormal   Collection Time    07/16/13  3:46 PM      Result Value Range   Glucose-Capillary 189 (*) 70 - 99 mg/dL   Comment 1 Notify RN    GLUCOSE, CAPILLARY     Status: Abnormal   Collection Time    07/16/13  8:07 PM      Result Value Range   Glucose-Capillary 201 (*) 70 - 99 mg/dL  GLUCOSE, CAPILLARY     Status: Abnormal   Collection Time    07/17/13 12:14 AM      Result Value Range   Glucose-Capillary 161 (*) 70 - 99 mg/dL  COMPREHENSIVE METABOLIC PANEL     Status: Abnormal   Collection Time    07/17/13  3:55 AM      Result Value Range   Sodium 139  137 - 147 mEq/L   Potassium 4.1  3.7 - 5.3 mEq/L   Chloride 103  96 - 112 mEq/L   CO2 25  19 - 32 mEq/L   Glucose, Bld 160 (*) 70 - 99 mg/dL   BUN 5 (*) 6 - 23 mg/dL   Creatinine, Ser 4.69  0.50 - 1.10 mg/dL  Calcium 8.1 (*) 8.4 - 10.5 mg/dL   Total Protein 6.1  6.0 - 8.3 g/dL   Albumin 2.4 (*) 3.5 - 5.2 g/dL   AST 12  0 - 37 U/L   ALT 10  0 - 35 U/L   Alkaline Phosphatase 97  39 - 117 U/L   Total Bilirubin 0.2 (*) 0.3 - 1.2 mg/dL   GFR calc non Af Amer >90  >90 mL/min   GFR calc Af Amer >90  >90 mL/min  GLUCOSE, CAPILLARY     Status: Abnormal   Collection Time    07/17/13  4:30 AM      Result Value Range   Glucose-Capillary 150 (*) 70 - 99 mg/dL  GLUCOSE, CAPILLARY     Status: Abnormal   Collection Time    07/17/13  8:37 AM      Result Value Range   Glucose-Capillary 199 (*) 70 - 99 mg/dL   Comment 1 Notify RN        Studies/Results: [place] Abdomen Complete  07/15/2013   CLINICAL DATA:  Pancreatitis. History of cholecystectomy, hypertension and diabetes.  EXAM: ULTRASOUND ABDOMEN COMPLETE  COMPARISON:  CT ABD W/CM dated 12/01/2005  FINDINGS: Gallbladder:  Surgically absent.  Common bile duct:  Diameter: 3.9 mm.  No evidence of choledocholithiasis.  Liver:  There is diffusely increased hepatic echogenicity with decreased penetration, consistent with steatosis. No focal lesions are identified.  IVC:  Largely  obscured by bowel gas.  No demonstrated abnormality.  Pancreas:  Largely obscured by bowel gas.  No demonstrated abnormality.  Spleen:  Size and appearance within normal limits.  Right Kidney:  Length: 11.7 cm. Echogenicity within normal limits. No mass or hydronephrosis visualized.  Left Kidney:  Length: 12.3 cm. Echogenicity within normal limits. No mass or hydronephrosis visualized.  Abdominal aorta:  Partially obscured by bowel gas.  No aneurysm visualized.  Other findings:  None.  IMPRESSION: 1. Examination is limited by body habitus and bowel gas. 2. Hepatic steatosis. No biliary dilatation following cholecystectomy. 3. No acute findings demonstrated. Portions of the aorta, IVC and pancreas are obscured by bowel gas.   Electronically Signed   By: Roxy HorsemanBill  Veazey M.D.   On: 07/15/2013 16:57   Ct Abdomen Pelvis W Contrast  07/16/2013   CLINICAL DATA:  Pancreatitis.  EXAM: CT ABDOMEN AND PELVIS WITH CONTRAST  TECHNIQUE: Multidetector CT imaging of the abdomen and pelvis was performed using the standard protocol following bolus administration of intravenous contrast.  CONTRAST:  80mL OMNIPAQUE IOHEXOL 300 MG/ML  SOLN  COMPARISON:  US ABDOMEN COMPLETE dated 07/15/2013; CT ABD W/CM dated 12/01/2005; CT PELVIS W/CM dated 12/01/2005  FINDINGS: There is atelectasis noted in the left lower lobe. Small left pleural effusion present.  Prior cholecystectomy. Mild low-density diffusely throughout the liver compatible with fatty infiltration. Spleen, adrenals and kidneys are normal.  There is stranding around the pancreas diffusely at, and also extending into the anterior para renal spaces bilaterally compatible with acute pancreatitis. No focal fluid collections. The pancreas appears to enhance normally. There is a small amount of free fluid in the pelvis.  Mild gaseous distention of both large and small bowel, which may reflect a mild ileus. No evidence of bowel obstruction. No free air or adenopathy. Scattered descending  colonic and sigmoid diverticulosis. Urinary bladder is unremarkable.  Prior hysterectomy. No adnexal masses. Appendix is visualized and is normal.  No acute bony abnormality.  IMPRESSION: Stranding/ fluid surrounds the pancreas and extends into the para renal spaces bilaterally  compatible with acute pancreatitis. No focal fluid collections or evidence of pancreatic necrosis.  Suspect mild diffuse fatty infiltration of the liver.  Small left pleural effusion.  Left lower lobe atelectasis.   Electronically Signed   By: Charlett Nose M.D.   On: 07/16/2013 15:51    Medications:  Prior to Admission:  Prescriptions prior to admission  Medication Sig Dispense Refill  . acetaminophen (TYLENOL) 500 MG tablet Take 1,000 mg by mouth every 6 (six) hours as needed. For headache      . aspirin EC 81 MG tablet Take 162 mg by mouth daily.      Marland Kitchen buPROPion (WELLBUTRIN XL) 150 MG 24 hr tablet Take 300 mg by mouth every evening.      . carbamazepine (TEGRETOL) 200 MG tablet Take 200 mg by mouth 2 (two) times daily.      . cetirizine (ZYRTEC) 10 MG tablet Take 10 mg by mouth daily.      . diphenhydrAMINE (BENADRYL) 25 MG tablet Take 12.5 mg by mouth at bedtime as needed. For sleep      . glimepiride (AMARYL) 4 MG tablet Take 4 mg by mouth daily with breakfast.      . lisinopril (PRINIVIL,ZESTRIL) 20 MG tablet Take 20 mg by mouth daily.      Marland Kitchen LORazepam (ATIVAN) 0.5 MG tablet Take 0.5-1 mg by mouth daily as needed for anxiety.      Marland Kitchen omeprazole (PRILOSEC OTC) 20 MG tablet Take 20 mg by mouth daily.      Marland Kitchen PARoxetine (PAXIL) 40 MG tablet Take 60 mg by mouth every morning.      . sitaGLIPtin (JANUVIA) 100 MG tablet Take 100 mg by mouth daily.       Scheduled: . aspirin EC  162 mg Oral Daily  . buPROPion  300 mg Oral QPM  . carbamazepine  200 mg Oral BID  . gi cocktail  30 mL Oral Once  . heparin  5,000 Units Subcutaneous Q8H  . insulin aspart  0-15 Units Subcutaneous Q4H  . pantoprazole (PROTONIX) IV  40 mg  Intravenous Q12H  . PARoxetine  60 mg Oral [id number]  . sodium chloride  3 mL Intravenous Q12H   Continuous: . sodium chloride 125 mL/hr at 07/17/13 0800   ZOX:[id number], hydrALAZINE, HYDROcodone-acetaminophen, LORazepam, morphine injection, ondansetron (ZOFRAN) IV, ondansetron, polyethylene glycol  Assessment/Plan: Acute pancreatitis probably secondary to elevated triglycerides, must also exclude Januvia. CT without any pseudocyst or other pathology. We will start clear liquids. Reduce IV fluids. Surprisingly patient has not required much pain medication. She appears stable for a floor bed  Diabetes poor control. Insulin will be required, the patient aware that she will need Lantus at home. She has been intolerant to metformin in the past.     LOS: 2 days   Ovidio Steele D 07/17/2013, 9:45 AM

## 2013-07-17 NOTE — Progress Notes (Signed)
Report called to 5W nurse, Edgardo RoysGreta.  Updated on patient history and current status.  Patient is stable and ready for transfer at this time.

## 2013-07-17 NOTE — Progress Notes (Signed)
Patient ID: Wendy MercuryCynthia K [name], female   DOB: October 10, 1955, 58 y.o.   MRN: [id number] Patient tolerating by mouth Intake, felt to be medically stable for telemetry floor bed. Please see previous note for further details

## 2013-07-17 NOTE — Progress Notes (Signed)
Spoke with patient about diabetes and home regimen for diabetes control.  Patient is visibly upset and tearful at this time. She reports that she is upset because she still has a clear liquid diet and she is very hungry.  Patient has unhooked herself from telemetry and reports that she is not going to agree to be hooked back up to monitors until she is given food.  RN currently on the phone trying to reach doctor to get diet advanced.  Patient stated that she called her husband to bring her "a hamburger from [facility]" but he told her he was not going to bring it right now so she is also aggravated with him.  Patient reports that she is no longer nauseated and has tolerated the clear liquid diet without any issues.  Attempted to calm patient and engage her in conversation to get her mind off of issue of getting food.   Patient reports that she takes Amaryl 4 mg and "another diabetes medication that starts with a T" but denies taking Januvia.  According to the patient she does not monitor her blood sugar at home at this point.  However, she reports that she has 3 glucometers and testing supplies at home.  Discussed A1C (12.5% on 07/15/13) results and explained what an A1C is, basic pathophysiology of DM Type 2, basic home care, importance of checking CBGs and maintaining good CBG control to prevent long-term and short-term complications. Patient reports that she has depression and she is not sure if she is receiving her same outpatient medications for depression as an inpatient.  She reports that due to the daily struggles she already has with depression is why she did not want to take insulin.  However, she reports that the doctor has explained to her that she will need to now go on insulin and she is agreeable to take insulin at discharge.  Educated patient on insulin pen use at home. Reviewed all steps if insulin pen including attachment of needle, 2-unit air shot, dialing up dose, giving injection, removing needle,  disposal of sharps, storage of unused insulin, disposal of insulin etc. Patient able to provide successful return demonstration.  MD to give patient Rxs for insulin pens and insulin pen needles at discharge.  RNs to provide ongoing basic DM education at bedside with this patient and engage patient to actively check blood glucose and administer insulin injections. Have ordered educational booklet, insulin starter kit, and DM videos. Asked that patient watch videos and diabetes coordinator will follow up with patient tomorrow.  Patient verbalized understanding of information discussed and reports that she does not have any further questions related to diabetes at this time.    Thanks, Barnie Alderman, RN, MSN, CCRN Diabetes Coordinator Inpatient Diabetes Program [phone] (Team Pager) [phone] ([place] office) [phone] [facility] office)

## 2013-07-17 NOTE — Progress Notes (Signed)
Inpatient Diabetes Program Recommendations  AACE/ADA: New Consensus Statement on Inpatient Glycemic Control (2013)  Target Ranges:  Prepandial:   less than 140 mg/dL      Peak postprandial:   less than 180 mg/dL (1-2 hours)      Critically ill patients:  140 - 180 mg/dL   Results for [name], [name] (MRN [id number]) as of 07/17/2013 08:13  Ref. Range 07/16/2013 00:12 07/16/2013 05:40 07/16/2013 07:54 07/16/2013 12:36 07/16/2013 15:46 07/16/2013 20:07 07/17/2013 00:14 07/17/2013 04:30  Glucose-Capillary Latest Range: 70-99 mg/dL 295250 (H) 284231 (H) 132239 (H) 236 (H) 189 (H) 201 (H) 161 (H) 150 (H)    Diabetes history: DM2 Outpatient Diabetes medications: Januvia 100 mg daily and Amaryl 4 mg QAM Current orders for Inpatient glycemic control: Novolog 0-15 units Q4H  Inpatient Diabetes Program Recommendations Insulin - Basal: Please consider ordering basal insulin; recommend starting with Lantus or Levemir 15 units daily. HgbA1C: Noted A1C 12.5% on 07/15/13.  Thanks, Orlando PennerMarie Sharece Fleischhacker, RN, MSN, CCRN Diabetes Coordinator Inpatient Diabetes Program [phone] (Team Pager) [phone] ([place] office) [phone] [facility] office)

## 2013-07-18 DIAGNOSIS — K859 Acute pancreatitis without necrosis or infection, unspecified: Secondary | ICD-10-CM | POA: Diagnosis not present

## 2013-07-18 DIAGNOSIS — F319 Bipolar disorder, unspecified: Secondary | ICD-10-CM

## 2013-07-18 DIAGNOSIS — F329 Major depressive disorder, single episode, unspecified: Secondary | ICD-10-CM | POA: Diagnosis not present

## 2013-07-18 DIAGNOSIS — E781 Pure hyperglyceridemia: Secondary | ICD-10-CM

## 2013-07-18 DIAGNOSIS — E119 Type 2 diabetes mellitus without complications: Secondary | ICD-10-CM | POA: Diagnosis not present

## 2013-07-18 DIAGNOSIS — I10 Essential (primary) hypertension: Secondary | ICD-10-CM | POA: Diagnosis not present

## 2013-07-18 LAB — GLUCOSE, CAPILLARY
GLUCOSE-CAPILLARY: 138 mg/dL — AB (ref 70–99)
Glucose-Capillary: 147 mg/dL — ABNORMAL HIGH (ref 70–99)
Glucose-Capillary: 256 mg/dL — ABNORMAL HIGH (ref 70–99)
Glucose-Capillary: 235 mg/dL — ABNORMAL HIGH (ref 70–99)

## 2013-07-18 LAB — COMPREHENSIVE METABOLIC PANEL
ALBUMIN: 2.5 g/dL — AB (ref 3.5–5.2)
ALK PHOS: 97 U/L (ref 39–117)
ALT: 11 U/L (ref 0–35)
AST: 12 U/L (ref 0–37)
BUN: 6 mg/dL (ref 6–23)
CHLORIDE: 102 meq/L (ref 96–112)
CO2: 24 mEq/L (ref 19–32)
Calcium: 8.8 mg/dL (ref 8.4–10.5)
Creatinine, Ser: 0.63 mg/dL (ref 0.50–1.10)
GFR calc Af Amer: >90 mL/min (ref >90)
GFR calc non Af Amer: >90 mL/min (ref >90)
Glucose, Bld: 154 mg/dL — ABNORMAL HIGH (ref 70–99)
Potassium: 3.6 mEq/L — ABNORMAL LOW (ref 3.7–5.3)
Sodium: 138 mEq/L (ref 137–147)
Total Protein: 6.4 g/dL (ref 6.0–8.3)

## 2013-07-18 LAB — LIPASE, BLOOD: Lipase: 236 U/L — ABNORMAL HIGH (ref 11–59)

## 2013-07-18 LAB — AMYLASE: Amylase: 102 U/L (ref 0–105)

## 2013-07-18 MED ORDER — SIMVASTATIN 20 MG PO TABS: 20.0000 mg | ORAL_TABLET | Freq: Every day | ORAL | Status: DC

## 2013-07-18 MED ORDER — INSULIN GLARGINE 100 UNIT/ML ~~LOC~~ SOLN: 20.0000 [IU] | Freq: Every day | SUBCUTANEOUS | Status: DC

## 2013-07-18 MED ORDER — INSULIN PEN STARTER KIT: 1.0000 | Freq: Once | Status: DC

## 2013-07-18 MED ORDER — PANTOPRAZOLE SODIUM 40 MG PO TBEC
40.0000 mg | DELAYED_RELEASE_TABLET | Freq: Every day | ORAL | Status: DC
  Administered 2013-07-18: 40 mg via ORAL
  Filled 2013-07-18: qty 1

## 2013-07-18 MED ORDER — LIVING WELL WITH DIABETES BOOK
Freq: Once | Status: AC
  Administered 2013-07-18: 13:00:00
  Filled 2013-07-18: qty 1

## 2013-07-18 NOTE — Progress Notes (Signed)
Subjective: No problems overnight, patient hungry wants to eat. Denies nausea vomiting. Per nursing no issues.  Objective: Vital signs in last 24 hours: Temp:  [98.2 F (36.8 C)-99.4 F (37.4 C)] 98.2 F (36.8 C) (02/06 0500) Pulse Rate:  [92-104] 92 (02/06 0500) Resp:  [17-20] 19 (02/06 0500) BP: (101-164)/(63-82) 152/82 mmHg (02/06 0500) SpO2:  [95 %-99 %] 96 % (02/06 0500) Weight:  [84 kg (185 lb 3 oz)] 84 kg (185 lb 3 oz) (02/06 0441) Weight change: -0.5 kg (-1 lb 1.6 oz) Last BM Date: 07/16/13  Intake/Output from previous day: 02/05 0701 - 02/06 0700 In: 879.3 [I.V.:879.3] Out: -  Intake/Output this shift:    General appearance: alert and cooperative Resp: clear to auscultation bilaterally Cardio: regular rate and rhythm, S1, S2 normal, no murmur, click, rub or gallop GI: soft, positive bowel sounds Extremities: extremities normal, atraumatic, no cyanosis or edema  Lab Results:  Results for orders placed during the hospital encounter of 07/15/13 (from the past 24 hour(s))  GLUCOSE, CAPILLARY     Status: Abnormal   Collection Time    07/17/13 12:48 PM      Result Value Range   Glucose-Capillary 220 (*) 70 - 99 mg/dL  GLUCOSE, CAPILLARY     Status: Abnormal   Collection Time    07/17/13  4:58 PM      Result Value Range   Glucose-Capillary 151 (*) 70 - 99 mg/dL  GLUCOSE, CAPILLARY     Status: Abnormal   Collection Time    07/17/13  8:07 PM      Result Value Range   Glucose-Capillary 200 (*) 70 - 99 mg/dL   Comment 1 Notify RN     Comment 2 Documented in Chart    GLUCOSE, CAPILLARY     Status: Abnormal   Collection Time    07/17/13 11:56 PM      Result Value Range   Glucose-Capillary 144 (*) 70 - 99 mg/dL   Comment 1 Notify RN     Comment 2 Documented in Chart    GLUCOSE, CAPILLARY     Status: Abnormal   Collection Time    07/18/13  3:39 AM      Result Value Range   Glucose-Capillary 138 (*) 70 - 99 mg/dL   Comment 1 Notify RN     Comment 2 Documented  in Chart    COMPREHENSIVE METABOLIC PANEL     Status: Abnormal   Collection Time    07/18/13  5:50 AM      Result Value Range   Sodium 138  137 - 147 mEq/L   Potassium 3.6 (*) 3.7 - 5.3 mEq/L   Chloride 102  96 - 112 mEq/L   CO2 24  19 - 32 mEq/L   Glucose, Bld 154 (*) 70 - 99 mg/dL   BUN 6  6 - 23 mg/dL   Creatinine, Ser 0.63  0.50 - 1.10 mg/dL   Calcium 8.8  8.4 - 10.5 mg/dL   Total Protein 6.4  6.0 - 8.3 g/dL   Albumin 2.5 (*) 3.5 - 5.2 g/dL   AST 12  0 - 37 U/L   ALT 11  0 - 35 U/L   Alkaline Phosphatase 97  39 - 117 U/L   Total Bilirubin <0.2 (*) 0.3 - 1.2 mg/dL   GFR calc non Af Amer >90  >90 mL/min   GFR calc Af Amer >90  >90 mL/min  GLUCOSE, CAPILLARY     Status: Abnormal  Collection Time    07/18/13  8:19 AM      Result Value Range   Glucose-Capillary 147 (*) 70 - 99 mg/dL      Studies/Results: Ct Abdomen Pelvis W Contrast  07/16/2013   CLINICAL DATA:  Pancreatitis.  EXAM: CT ABDOMEN AND PELVIS WITH CONTRAST  TECHNIQUE: Multidetector CT imaging of the abdomen and pelvis was performed using the standard protocol following bolus administration of intravenous contrast.  CONTRAST:  70m OMNIPAQUE IOHEXOL 300 MG/ML  SOLN  COMPARISON:  [place] dated 07/15/2013; CT ABD W/CM dated 12/01/2005; CT PELVIS W/CM dated 12/01/2005  FINDINGS: There is atelectasis noted in the left lower lobe. Small left pleural effusion present.  Prior cholecystectomy. Mild low-density diffusely throughout the liver compatible with fatty infiltration. Spleen, adrenals and kidneys are normal.  There is stranding around the pancreas diffusely at, and also extending into the anterior para renal spaces bilaterally compatible with acute pancreatitis. No focal fluid collections. The pancreas appears to enhance normally. There is a small amount of free fluid in the pelvis.  Mild gaseous distention of both large and small bowel, which may reflect a mild ileus. No evidence of bowel obstruction. No free air or  adenopathy. Scattered descending colonic and sigmoid diverticulosis. Urinary bladder is unremarkable.  Prior hysterectomy. No adnexal masses. Appendix is visualized and is normal.  No acute bony abnormality.  IMPRESSION: Stranding/ fluid surrounds the pancreas and extends into the para renal spaces bilaterally compatible with acute pancreatitis. No focal fluid collections or evidence of pancreatic necrosis.  Suspect mild diffuse fatty infiltration of the liver.  Small left pleural effusion.  Left lower lobe atelectasis.   Electronically Signed   By: KRolm BaptiseM.D.   On: 07/16/2013 15:51    Medications:  Prior to Admission:  Prescriptions prior to admission  Medication Sig Dispense Refill  . acetaminophen (TYLENOL) 500 MG tablet Take 1,000 mg by mouth every 6 (six) hours as needed. For headache      . aspirin EC 81 MG tablet Take 162 mg by mouth daily.      .Marland KitchenbuPROPion (WELLBUTRIN XL) 150 MG 24 hr tablet Take 300 mg by mouth every evening.      . carbamazepine (TEGRETOL) 200 MG tablet Take 200 mg by mouth 2 (two) times daily.      . cetirizine (ZYRTEC) 10 MG tablet Take 10 mg by mouth daily.      . diphenhydrAMINE (BENADRYL) 25 MG tablet Take 12.5 mg by mouth at bedtime as needed. For sleep      . glimepiride (AMARYL) 4 MG tablet Take 4 mg by mouth daily with breakfast.      . lisinopril (PRINIVIL,ZESTRIL) 20 MG tablet Take 20 mg by mouth daily.      .Marland KitchenLORazepam (ATIVAN) 0.5 MG tablet Take 0.5-1 mg by mouth daily as needed for anxiety.      .Marland Kitchenomeprazole (PRILOSEC OTC) 20 MG tablet Take 20 mg by mouth daily.      .Marland KitchenPARoxetine (PAXIL) 40 MG tablet Take 60 mg by mouth every morning.      . sitaGLIPtin (JANUVIA) 100 MG tablet Take 100 mg by mouth daily.       Scheduled: . aspirin EC  162 mg Oral Daily  . buPROPion  300 mg Oral Daily  . carbamazepine  200 mg Oral BID  . gi cocktail  30 mL Oral Once  . heparin  5,000 Units Subcutaneous Q8H  . insulin aspart  0-15 Units Subcutaneous  Q4H  .  insulin glargine  12 Units Subcutaneous Daily  . Insulin Pen Starter Kit  1 kit Other Once  . pantoprazole (PROTONIX) IV  40 mg Intravenous Q12H  . PARoxetine  60 mg Oral QHS  . sodium chloride  3 mL Intravenous Q12H   Continuous:  OUZ:[id number], hydrALAZINE, HYDROcodone-acetaminophen, LORazepam, morphine injection, ondansetron (ZOFRAN) IV, ondansetron, polyethylene glycol  Assessment/Plan: Acute pancreatitis, clinically improved, check followup labs amylase lipase. Etiology secondary to triglycerides and possibly Januvia. Patient has been noncompliant with her statin. She is hungry and desires to eat, there is no nausea no vomiting. Her diet will be advanced. Pending tolerance of diet hopefully discharged home today  Diabetes poor control I have discussed with patient the need to start insulin, nurse has been asked to have diabetic education in order to instruct patient how to inject insulin  Hyperlipidemia/hypertriglyceridemia. Recommend resume statin also add TriCor.  Bipolar continue current psych meds   LOS: 3 days   Kaylia Winborne D 07/18/2013, 9:16 AM

## 2013-07-18 NOTE — Progress Notes (Signed)
NURSING PROGRESS NOTE  [name] [id number] Discharge Data: 07/18/2013 4:39 PM Attending Provider: Kandice Hams, MD PCP:No PCP Per Patient     [name] to be D/C'd Home per MD order.  Discussed with the patient the After Visit Summary and all questions fully answered. All IV's discontinued with no bleeding noted. All belongings returned to patient for patient to take home.   Last Vital Signs:  Blood pressure 162/85, pulse 100, temperature 98.5 F (36.9 C), temperature source Oral, resp. rate 20, height '5\' 4"'  (1.626 m), weight 84 kg (185 lb 3 oz), SpO2 97.00%.  Discharge Medication List   Medication List    STOP taking these medications       acetaminophen 500 MG tablet  Commonly known as:  TYLENOL     sitaGLIPtin 100 MG tablet  Commonly known as:  JANUVIA      TAKE these medications       aspirin EC 81 MG tablet  Take 162 mg by mouth daily.     buPROPion 150 MG 24 hr tablet  Commonly known as:  WELLBUTRIN XL  Take 300 mg by mouth every evening.     carbamazepine 200 MG tablet  Commonly known as:  TEGRETOL  Take 200 mg by mouth 2 (two) times daily.     cetirizine 10 MG tablet  Commonly known as:  ZYRTEC  Take 10 mg by mouth daily.     diphenhydrAMINE 25 MG tablet  Commonly known as:  BENADRYL  Take 12.5 mg by mouth at bedtime as needed. For sleep     glimepiride 4 MG tablet  Commonly known as:  AMARYL  Take 4 mg by mouth daily with breakfast.     insulin glargine 100 UNIT/ML injection  Commonly known as:  LANTUS  Inject 0.2 mLs (20 Units total) into the skin daily.     Insulin Pen Starter Kit Misc  1 kit by Other route once.     lisinopril 20 MG tablet  Commonly known as:  PRINIVIL,ZESTRIL  Take 20 mg by mouth daily.     LORazepam 0.5 MG tablet  Commonly known as:  ATIVAN  Take 0.5-1 mg by mouth daily as needed for anxiety.     omeprazole 20 MG tablet  Commonly known as:  PRILOSEC OTC  Take 20 mg by mouth daily.     PARoxetine 40 MG  tablet  Commonly known as:  PAXIL  Take 60 mg by mouth every morning.     simvastatin 20 MG tablet  Commonly known as:  ZOCOR  Take 1 tablet (20 mg total) by mouth daily.

## 2013-07-18 NOTE — Plan of Care (Signed)
Problem: Food- and Nutrition-Related Knowledge Deficit (NB-1.1) Goal: Nutrition education Formal process to instruct or train a patient/client in a skill or to impart knowledge to help patients/clients voluntarily manage or modify food choices and eating behavior to maintain or improve health. Outcome: Completed/Met Date Met:  07/18/13  RD consulted for nutrition education regarding diabetes. Instructed pt on Carbohydrate Counting. Pt with several questions about insulin usage - RD contacted Diabetic Coordinator to alert them.    Lab Results  Component Value Date    HGBA1C 12.5* 07/15/2013    RD provided "Carbohydrate Counting for People with Diabetes" handout from the Academy of [employer]. Discussed different food groups and their effects on blood sugar, emphasizing carbohydrate-containing foods. Provided list of carbohydrates and recommended serving sizes of common foods.  Discussed importance of controlled and consistent carbohydrate intake throughout the day. Provided examples of ways to balance meals/snacks and encouraged intake of high-fiber, whole grain complex carbohydrates. Teach back method used.  Expect fair compliance.  Body mass index is 31.77 kg/(m^2). Pt meets criteria for Obese Class I based on current BMI.  Current diet order is Carbohydrate Modified Medium, no meal intake recorded. Labs and medications reviewed. No further nutrition interventions warranted at this time. RD contact information provided. If additional nutrition issues arise, please re-consult RD.  Inda Coke MS, RD, LDN Pager: [phone] After-hours pager: [phone]

## 2013-07-18 NOTE — Care Management Note (Signed)
    Page 1 of 1   07/18/2013     4:22:39 PM   CARE MANAGEMENT NOTE 07/18/2013  Patient:  [name],[name]   Account Number:  [id number]  Date Initiated:  07/15/2013  Documentation initiated [facility]:  [facility]  Subjective/Objective Assessment:   Admitted with acute pancreatitis.     Action/Plan:   Anticipated DC Date:  07/18/2013   Anticipated DC Plan:  HOME/SELF CARE      DC Planning Services  CM consult      Choice offered to / List presented to:             Status of service:  Completed, signed off Medicare Important Message given?   (If response is "NO", the following Medicare IM given date fields will be blank) Date Medicare IM given:   Date Additional Medicare IM given:    Discharge Disposition:  HOME/SELF CARE  Per UR Regulation:  Reviewed for med. necessity/level of care/duration of stay  If discussed at Long Length of Stay Meetings, dates discussed:    Comments:  ContactBabs [name]:  [name] [phone]  07/18/13 1620 Wendy Capeeborah Jakolby Sedivy RN, BSN [phone] patient lives with [name],  patient for dc today per MD note.  No NCM referral, no needs anticipated.

## 2013-07-18 NOTE — Discharge Summary (Signed)
Physician Discharge Summary  Patient ID: [name] MRN: [id number] DOB/AGE: [birth year] 58 y.o.  Admit date: 07/15/2013 Discharge date: 07/18/2013  Admission Diagnoses:  Discharge Diagnoses:  Principal Problem:   Pancreatitis Active Problems:   Diabetes mellitus without complication   Hypertension   High cholesterol   Depression   GERD (gastroesophageal reflux disease)   Acute pancreatitis   Bipolar disorder, unspecified   Hypertriglyceridemia   Discharged Condition: stable  Hospital Course:  The patient presented to the hospital with complaint of abdominal pain, evaluation in the ER reveal elevated lipase consistent with pancreatitis. Admission was deemed necessary for further evaluation and treatment. Of note patient has had her gallbladder removed in the past. She does not consume any alcohol. Lipid panel showed markedly elevated triglyceride of 3000. Patient was on Januvia which is associated with pancreatitis according to PDR. Patient was treated in the typical fashion bowel rest IV fluids. Initial CBC showed anemia however followup CBCs showed a hemoglobin of greater than 11. CT scan of the abdomen and pelvis was obtained which did show pancreatitis but no other pathology pseudocyst or pancreatic necrosis. Patient had rapid improvement in her symptoms as well as reduction in her lipase to a normal range. She was tolerant of by mouth intake. She did not require much pain medication. I did discuss with patient that her triglycerides probably were the cause for her pancreatitis. She should be on a statin but has stopped taking her medication. She has never had triglycerides that high in the office. In regards to her diabetes she has not been taken  her medications as prescribed.her outpatient followup has been poor over the last 6 months. At this point we discussed insulin will be require and we will not resume Januvia. Patient has been instructed how to inject herself with Lantus. She  will have a one week followup. She's encouraged to resume her statin and we may consider TriCor on outpatient basis  Consults: Treatment Team:  Kandice Hams, MD  Significant Diagnostic Studies:Us Abdomen Complete  07/15/2013   CLINICAL DATA:  Pancreatitis. History of cholecystectomy, hypertension and diabetes.  EXAM: ULTRASOUND ABDOMEN COMPLETE  COMPARISON:  CT ABD W/CM dated 12/01/2005  FINDINGS: Gallbladder:  Surgically absent.  Common bile duct:  Diameter: 3.9 mm.  No evidence of choledocholithiasis.  Liver:  There is diffusely increased hepatic echogenicity with decreased penetration, consistent with steatosis. No focal lesions are identified.  IVC:  Largely obscured by bowel gas.  No demonstrated abnormality.  Pancreas:  Largely obscured by bowel gas.  No demonstrated abnormality.  Spleen:  Size and appearance within normal limits.  Right Kidney:  Length: 11.7 cm. Echogenicity within normal limits. No mass or hydronephrosis visualized.  Left Kidney:  Length: 12.3 cm. Echogenicity within normal limits. No mass or hydronephrosis visualized.  Abdominal aorta:  Partially obscured by bowel gas.  No aneurysm visualized.  Other findings:  None.  IMPRESSION: 1. Examination is limited by body habitus and bowel gas. 2. Hepatic steatosis. No biliary dilatation following cholecystectomy. 3. No acute findings demonstrated. Portions of the aorta, IVC and pancreas are obscured by bowel gas.   Electronically Signed   By: Camie Patience M.D.   On: 07/15/2013 16:57   Ct Abdomen Pelvis W Contrast  07/16/2013   CLINICAL DATA:  Pancreatitis.  EXAM: CT ABDOMEN AND PELVIS WITH CONTRAST  TECHNIQUE: Multidetector CT imaging of the abdomen and pelvis was performed using the standard protocol following bolus administration of intravenous contrast.  CONTRAST:  84m OMNIPAQUE  IOHEXOL 300 MG/ML  SOLN  COMPARISON:  US ABDOMEN COMPLETE dated 07/15/2013; CT ABD W/CM dated 12/01/2005; CT PELVIS W/CM dated 12/01/2005  FINDINGS: There is  atelectasis noted in the left lower lobe. Small left pleural effusion present.  Prior cholecystectomy. Mild low-density diffusely throughout the liver compatible with fatty infiltration. Spleen, adrenals and kidneys are normal.  There is stranding around the pancreas diffusely at, and also extending into the anterior para renal spaces bilaterally compatible with acute pancreatitis. No focal fluid collections. The pancreas appears to enhance normally. There is a small amount of free fluid in the pelvis.  Mild gaseous distention of both large and small bowel, which may reflect a mild ileus. No evidence of bowel obstruction. No free air or adenopathy. Scattered descending colonic and sigmoid diverticulosis. Urinary bladder is unremarkable.  Prior hysterectomy. No adnexal masses. Appendix is visualized and is normal.  No acute bony abnormality.  IMPRESSION: Stranding/ fluid surrounds the pancreas and extends into the para renal spaces bilaterally compatible with acute pancreatitis. No focal fluid collections or evidence of pancreatic necrosis.  Suspect mild diffuse fatty infiltration of the liver.  Small left pleural effusion.  Left lower lobe atelectasis.   Electronically Signed   By: Rolm Baptise M.D.   On: 07/16/2013 15:51      Discharge Exam: Blood pressure 162/85, pulse 100, temperature 98.5 F (36.9 C), temperature source Oral, resp. rate 20, height 5' 4" (1.626 m), weight 84 kg (185 lb 3 oz), SpO2 97.00%. General appearance: alert and cooperative Resp: clear to auscultation bilaterally Cardio: regular rate and rhythm, S1, S2 normal, no murmur, click, rub or gallop GI: soft positive bowel sounds minimal tenderness Extremities: extremities normal, atraumatic, no cyanosis or edema  Disposition: 01-Home or Self Care     Medication List    STOP taking these medications       acetaminophen 500 MG tablet  Commonly known as:  TYLENOL     sitaGLIPtin 100 MG tablet  Commonly known as:  JANUVIA       TAKE these medications       aspirin EC 81 MG tablet  Take 162 mg by mouth daily.     buPROPion 150 MG 24 hr tablet  Commonly known as:  WELLBUTRIN XL  Take 300 mg by mouth every evening.     carbamazepine 200 MG tablet  Commonly known as:  TEGRETOL  Take 200 mg by mouth 2 (two) times daily.     cetirizine 10 MG tablet  Commonly known as:  ZYRTEC  Take 10 mg by mouth daily.     diphenhydrAMINE 25 MG tablet  Commonly known as:  BENADRYL  Take 12.5 mg by mouth at bedtime as needed. For sleep     glimepiride 4 MG tablet  Commonly known as:  AMARYL  Take 4 mg by mouth daily with breakfast.     insulin glargine 100 UNIT/ML injection  Commonly known as:  LANTUS  Inject 0.2 mLs (20 Units total) into the skin daily.     Insulin Pen Starter Kit Misc  1 kit by Other route once.     lisinopril 20 MG tablet  Commonly known as:  PRINIVIL,ZESTRIL  Take 20 mg by mouth daily.     LORazepam 0.5 MG tablet  Commonly known as:  ATIVAN  Take 0.5-1 mg by mouth daily as needed for anxiety.     omeprazole 20 MG tablet  Commonly known as:  PRILOSEC OTC  Take 20 mg by mouth daily.  PARoxetine 40 MG tablet  Commonly known as:  PAXIL  Take 60 mg by mouth every morning.     simvastatin 20 MG tablet  Commonly known as:  ZOCOR  Take 1 tablet (20 mg total) by mouth daily.           Follow-up Information   Follow up with Anael Rosch D, MD In 1 week.   Specialty:  Internal Medicine   Contact information:   [address] [place] [address] [phone]      40 minutes were spent in the discharge process of this patient, medication reconciliation discussing with patient and nurse Signed: Kamiryn Bezanson D 07/18/2013, 4:00 PM

## 2013-07-18 NOTE — Progress Notes (Signed)
Meet with patient to reinforce diabetes education and insulin pen teaching.  Patient appears cheerful and content today.  She reports that she is eager to learn and she reports she has learned a lot from the RD consult about how she can choose better foods to help control her diabetes.  Reviewed content of insulin flexpen starter kit. Reviewed all steps of insulin pen administration and patient verbalized understanding of information discussed. Patient reports that she has been watching the diabetes education videos and looking through the Living Well With Diabetes book. Stressed importance of monitoring blood glucose and keeping a record to take with her to follow up appointments with her PCP. Patient inquired about when to check blood sugar. Instructed patient to check her blood sugar first thing in the morning, before lunch, before supper, and before bedtime.  Patient reports that she is willing to check her blood sugar that often.  However, she will need prescriptions for testing supplies (strips and lancets) for her glucometer at time of discharge.  Reviewed signs and symptoms of hyperglycemia and hypoglycemia along with treatment for both. RNs to provide ongoing basic DM education at bedside with this patient and engage patient to actively check blood glucose and administer insulin injections. Patient verbalized understanding of all information discussed and reports that she does not have any further questions related to diabetes at this time.  AT TIME OF DISCHARGE: MD to give patient Rxs for insulin pens, insulin pen needles, Ultra One Touch testing strips, and lancets.  Thanks, Barnie Alderman, RN, MSN, CCRN Diabetes Coordinator Inpatient Diabetes Program [phone] (Team Pager) [phone] ([place] office) [phone] [facility] office)

## 2013-07-25 DIAGNOSIS — IMO0001 Reserved for inherently not codable concepts without codable children: Secondary | ICD-10-CM | POA: Diagnosis not present

## 2013-07-25 DIAGNOSIS — K859 Acute pancreatitis without necrosis or infection, unspecified: Secondary | ICD-10-CM | POA: Diagnosis not present

## 2013-07-25 DIAGNOSIS — E782 Mixed hyperlipidemia: Secondary | ICD-10-CM | POA: Diagnosis not present

## 2013-07-25 DIAGNOSIS — I10 Essential (primary) hypertension: Secondary | ICD-10-CM | POA: Diagnosis not present

## 2013-07-25 DIAGNOSIS — E781 Pure hyperglyceridemia: Secondary | ICD-10-CM | POA: Diagnosis not present

## 2013-08-14 DIAGNOSIS — E109 Type 1 diabetes mellitus without complications: Secondary | ICD-10-CM | POA: Diagnosis not present

## 2013-08-14 DIAGNOSIS — H1045 Other chronic allergic conjunctivitis: Secondary | ICD-10-CM | POA: Diagnosis not present

## 2013-08-21 DIAGNOSIS — IMO0001 Reserved for inherently not codable concepts without codable children: Secondary | ICD-10-CM | POA: Diagnosis not present

## 2013-08-21 DIAGNOSIS — E781 Pure hyperglyceridemia: Secondary | ICD-10-CM | POA: Diagnosis not present

## 2013-08-28 ENCOUNTER — Other Ambulatory Visit: Payer: Self-pay

## 2013-08-28 DIAGNOSIS — Z1231 Encounter for screening mammogram for malignant neoplasm of breast: Secondary | ICD-10-CM

## 2013-09-02 DIAGNOSIS — F39 Unspecified mood [affective] disorder: Secondary | ICD-10-CM | POA: Diagnosis not present

## 2013-09-09 ENCOUNTER — Ambulatory Visit: Payer: Medicare Other

## 2013-09-10 ENCOUNTER — Ambulatory Visit
Admission: RE | Admit: 2013-09-10 | Discharge: 2013-09-10 | Disposition: A | Discharge status: Home / Self Care | Payer: Medicare Other | Source: Ambulatory Visit

## 2013-09-10 DIAGNOSIS — Z1231 Encounter for screening mammogram for malignant neoplasm of breast: Secondary | ICD-10-CM

## 2013-09-18 DIAGNOSIS — N182 Chronic kidney disease, stage 2 (mild): Secondary | ICD-10-CM | POA: Diagnosis not present

## 2013-09-18 DIAGNOSIS — E782 Mixed hyperlipidemia: Secondary | ICD-10-CM | POA: Diagnosis not present

## 2013-09-18 DIAGNOSIS — E1129 Type 2 diabetes mellitus with other diabetic kidney complication: Secondary | ICD-10-CM | POA: Diagnosis not present

## 2013-09-18 DIAGNOSIS — E781 Pure hyperglyceridemia: Secondary | ICD-10-CM | POA: Diagnosis not present

## 2013-09-23 ENCOUNTER — Encounter: Payer: Self-pay | Admitting: *Deleted

## 2013-09-23 ENCOUNTER — Encounter: Payer: Medicare Other | Attending: Internal Medicine | Admitting: *Deleted

## 2013-09-23 VITALS — Ht 64.0 in | Wt 182.6 lb

## 2013-09-23 DIAGNOSIS — Z794 Long term (current) use of insulin: Secondary | ICD-10-CM | POA: Insufficient documentation

## 2013-09-23 DIAGNOSIS — E119 Type 2 diabetes mellitus without complications: Secondary | ICD-10-CM

## 2013-09-23 DIAGNOSIS — Z713 Dietary counseling and surveillance: Secondary | ICD-10-CM | POA: Insufficient documentation

## 2013-09-23 NOTE — Progress Notes (Signed)
Appt start time: 1400 end time:  1500.  Assessment:  Patient was seen on  09/23/13 for individual diabetes education. Lives with husband, she shops and prepares the meals. She started on 70/30 insulin after pancreatitis in Feb. 2015. She states her BG's are doing better with insulin now. SMBG twice a day, with reported range of 90- 235 between pre and post meals. Enjoys swimming for her exercise.   Current HbA1c: 9.4%  Preferred Learning Style:   No preference indicated   Learning Readiness:   Ready  Change in progress  MEDICATIONS: see list, diabetes medication is Novolog 70/30 insulin @ 20 units twice a day  DIETARY INTAKE:  24-hr recall:  B ( AM): 2 pieces chicken, chips mixture, OR cereal with 2% milk and monk fruit sweetener, diet iced tea, diet Sprite Snk ( AM): no usually, if so- SF pudding sometimes with a banana  L ( PM): salad with lots of raw vegetables, avocado, Caesar dressing, diet tea or diet soda Snk ( PM): depends on if it is an "eating day" or not D ( PM): usually a lighter meal: fresh fruit and PNB OR homemade vegetable soup Snk ( PM): varies depending on time supper was eaten, SF pudding OR SF chocolate candy Beverages: diet iced tea, diet Sprite   Usual physical activity: prefers swimming  Estimated energy needs: 1400 calories 158 g carbohydrates 105 g protein 39 g fat  Progress Towards Goal(s):  In progress.   Nutritional Diagnosis:  NB-1.1 Food and nutrition-related knowledge deficit As related to diabetes.  As evidenced by A1c of 9.4%.    Intervention:  Nutrition counseling provided.  Discussed diabetes disease process and treatment options.  Discussed physiology of diabetes and role of obesity on insulin resistance.  Encouraged moderate weight reduction to improve glucose levels.  Discussed role of medications and diet in glucose control  Discussed blood glucose monitoring and interpretation.  Discussed recommended target ranges and individual  ranges.    Discussed role of stress on blood glucose levels and discussed strategies to manage psychosocial issues.  Described short-term complications: hyper- and hypo-glycemia.  Discussed causes,symptoms, and treatment options.  Discussed prevention, detection, and treatment of long-term complications.  Discussed the role of prolonged elevated glucose levels on body systems.  At our next visit, plan to discuss:  Education on macronutrients on glucose levels.  Provided education on carb counting, importance of regularly scheduled meals/snacks, and meal planning  Effects of physical activity on glucose levels and long-term glucose control.  Recommend goal of 150 minutes of physical activity/week.  Patient medications.  Discussed role of medication on blood glucose and possible side effects  Recommendations for long-term diabetes self-care.  Provided checklist for medical, dental, and emotional self-care.  Teaching Method Utilized: Visual, Auditory and Hands on  Handouts given during visit include: Living Well with Diabetes Carb Counting and Food Label handouts Meal Plan Card Insulin action handout  Barriers to learning/adherence to lifestyle change: none  Diabetes self-care support plan:   [facility] support group  Demonstrated degree of understanding via:  Teach Back   Monitoring/Evaluation:  Dietary intake, exercise, continued SMBG, and body weight in 4 week(s).

## 2013-10-16 DIAGNOSIS — E782 Mixed hyperlipidemia: Secondary | ICD-10-CM | POA: Diagnosis not present

## 2013-10-16 DIAGNOSIS — E1129 Type 2 diabetes mellitus with other diabetic kidney complication: Secondary | ICD-10-CM | POA: Diagnosis not present

## 2013-10-16 DIAGNOSIS — N182 Chronic kidney disease, stage 2 (mild): Secondary | ICD-10-CM | POA: Diagnosis not present

## 2013-10-16 DIAGNOSIS — R079 Chest pain, unspecified: Secondary | ICD-10-CM | POA: Diagnosis not present

## 2013-10-24 ENCOUNTER — Encounter: Payer: Medicare Other | Attending: Internal Medicine | Admitting: *Deleted

## 2013-10-24 VITALS — Ht 64.0 in | Wt 180.0 lb

## 2013-10-24 DIAGNOSIS — Z794 Long term (current) use of insulin: Secondary | ICD-10-CM | POA: Diagnosis not present

## 2013-10-24 DIAGNOSIS — Z713 Dietary counseling and surveillance: Secondary | ICD-10-CM | POA: Diagnosis not present

## 2013-10-24 DIAGNOSIS — E119 Type 2 diabetes mellitus without complications: Secondary | ICD-10-CM | POA: Diagnosis not present

## 2013-10-24 NOTE — Progress Notes (Signed)
Appt start time: 0800 end time:  0900.  Assessment:  Patient was seen on  10/24/13 for individual diabetes education follow up visit. She states she is not longer taking Amaryl for her diabetes but has continued with Novolog 70/30 insulin and BGs are starting to come down. She continues to swim for her exercise and enjoys that a great deal.   Current HbA1c: 9.4% now down to 8.0%  Preferred Learning Style:   No preference indicated   Learning Readiness:   Ready  Change in progress  MEDICATIONS: see list, diabetes medication is Novolog 70/30 insulin @ 20 units twice a day  DIETARY INTAKE:  24-hr recall:  B ( AM): 2 pieces chicken, chips mixture, OR cereal with 2% milk and monk fruit sweetener, diet iced tea, diet Sprite Snk ( AM): no usually, if so- SF pudding sometimes with a banana  L ( PM): salad with lots of raw vegetables, avocado, Caesar dressing, diet tea or diet soda Snk ( PM): depends on if it is an "eating day" or not D ( PM): usually a lighter meal: fresh fruit and PNB OR homemade vegetable soup Snk ( PM): varies depending on time supper was eaten, SF pudding OR SF chocolate candy Beverages: diet iced tea, diet Sprite   Usual physical activity: prefers swimming  Estimated energy needs: 1400 calories 158 g carbohydrates 105 g protein 39 g fat  Progress Towards Goal(s):  In progress.   Nutritional Diagnosis:  NB-1.1 Food and nutrition-related knowledge deficit As related to diabetes.  As evidenced by A1c of 9.4% which patient states is now down to 8%    Intervention:  Nutrition counseling provided.  Education on macronutrients on glucose levels.  Provided education on carb counting, importance of regularly scheduled meals/snacks, and meal planning  Effects of physical activity on glucose levels and long-term glucose control.  Recommend goal of 150 minutes of physical activity/week.  Patient medications.  Discussed role of medication on blood glucose and possible  side effects  Recommendations for long-term diabetes self-care.  Provided checklist for medical, dental, and emotional self-care.  Plan:  Aim for 2 Carb Choices per meal (30 grams) +/- 1 either way  Aim for 0-1 Carbs per snack if hungry  Include protein in moderation with your meals and snacks Consider reading food labels for Total Carbohydrate of foods Continue with your activity level by swimming for 45 minutes as tolerated Continue checking BG at alternate times per day as directed by MD  Continue taking medication of insulin 70/30 as directed by MD   Teaching Method Utilized: Visual, Auditory and Hands on  Handouts given during visit include: Carb Counting and Food Label handouts Meal Plan Card  Barriers to learning/adherence to lifestyle change: none  Diabetes self-care support plan:   [facility] support [facility]  Demonstrated degree of understanding via:  Teach Back   Monitoring/Evaluation:  Dietary intake, exercise, continued SMBG, and body weight PRN

## 2013-10-24 NOTE — Patient Instructions (Signed)
Plan:  Aim for 2 Carb Choices per meal (30 grams) +/- 1 either way  Aim for 0-1 Carbs per snack if hungry  Include protein in moderation with your meals and snacks Consider reading food labels for Total Carbohydrate of foods Continue with your activity level by swimming for 45 minutes as tolerated Continue checking BG at alternate times per day as directed by MD  Continue taking medication of insulin 70/30 as directed by MD

## 2013-10-31 ENCOUNTER — Encounter: Payer: Self-pay | Admitting: *Deleted

## 2014-01-05 DIAGNOSIS — F39 Unspecified mood [affective] disorder: Secondary | ICD-10-CM | POA: Diagnosis not present

## 2014-01-05 DIAGNOSIS — Z79899 Other long term (current) drug therapy: Secondary | ICD-10-CM | POA: Diagnosis not present

## 2014-01-15 ENCOUNTER — Ambulatory Visit
Admission: RE | Admit: 2014-01-15 | Discharge: 2014-01-15 | Disposition: A | Discharge status: Home / Self Care | Payer: Medicare Other | Source: Ambulatory Visit | Attending: Internal Medicine | Admitting: Internal Medicine

## 2014-01-15 ENCOUNTER — Other Ambulatory Visit: Payer: Self-pay | Admitting: Internal Medicine

## 2014-01-15 DIAGNOSIS — M25549 Pain in joints of unspecified hand: Secondary | ICD-10-CM | POA: Diagnosis not present

## 2014-01-15 DIAGNOSIS — M19049 Primary osteoarthritis, unspecified hand: Secondary | ICD-10-CM | POA: Diagnosis not present

## 2014-01-15 DIAGNOSIS — I10 Essential (primary) hypertension: Secondary | ICD-10-CM | POA: Diagnosis not present

## 2014-01-15 DIAGNOSIS — E669 Obesity, unspecified: Secondary | ICD-10-CM | POA: Diagnosis not present

## 2014-01-15 DIAGNOSIS — E782 Mixed hyperlipidemia: Secondary | ICD-10-CM | POA: Diagnosis not present

## 2014-01-15 DIAGNOSIS — M79641 Pain in right hand: Secondary | ICD-10-CM

## 2014-01-15 DIAGNOSIS — Z6832 Body mass index (BMI) 32.0-32.9, adult: Secondary | ICD-10-CM | POA: Diagnosis not present

## 2014-01-15 DIAGNOSIS — IMO0001 Reserved for inherently not codable concepts without codable children: Secondary | ICD-10-CM | POA: Diagnosis not present

## 2014-02-23 DIAGNOSIS — J029 Acute pharyngitis, unspecified: Secondary | ICD-10-CM | POA: Diagnosis not present

## 2014-02-23 DIAGNOSIS — R635 Abnormal weight gain: Secondary | ICD-10-CM | POA: Diagnosis not present

## 2014-02-23 DIAGNOSIS — R5383 Other fatigue: Secondary | ICD-10-CM | POA: Diagnosis not present

## 2014-02-23 DIAGNOSIS — R5381 Other malaise: Secondary | ICD-10-CM | POA: Diagnosis not present

## 2014-02-23 DIAGNOSIS — R61 Generalized hyperhidrosis: Secondary | ICD-10-CM | POA: Diagnosis not present

## 2014-03-22 ENCOUNTER — Emergency Department (HOSPITAL_COMMUNITY)
Admission: EM | Admit: 2014-03-22 | Discharge: 2014-03-23 | Disposition: A | Discharge status: Other Acute Inpatient Hospital | Payer: Medicare Other | Attending: Emergency Medicine | Admitting: Emergency Medicine

## 2014-03-22 ENCOUNTER — Encounter (HOSPITAL_COMMUNITY): Payer: Self-pay | Admitting: Emergency Medicine

## 2014-03-22 DIAGNOSIS — Z7982 Long term (current) use of aspirin: Secondary | ICD-10-CM | POA: Diagnosis not present

## 2014-03-22 DIAGNOSIS — E780 Pure hypercholesterolemia: Secondary | ICD-10-CM | POA: Insufficient documentation

## 2014-03-22 DIAGNOSIS — F319 Bipolar disorder, unspecified: Secondary | ICD-10-CM | POA: Diagnosis not present

## 2014-03-22 DIAGNOSIS — I10 Essential (primary) hypertension: Secondary | ICD-10-CM | POA: Insufficient documentation

## 2014-03-22 DIAGNOSIS — K219 Gastro-esophageal reflux disease without esophagitis: Secondary | ICD-10-CM | POA: Insufficient documentation

## 2014-03-22 DIAGNOSIS — E119 Type 2 diabetes mellitus without complications: Secondary | ICD-10-CM | POA: Insufficient documentation

## 2014-03-22 DIAGNOSIS — F329 Major depressive disorder, single episode, unspecified: Secondary | ICD-10-CM | POA: Diagnosis present

## 2014-03-22 DIAGNOSIS — Z79899 Other long term (current) drug therapy: Secondary | ICD-10-CM | POA: Insufficient documentation

## 2014-03-22 DIAGNOSIS — Z794 Long term (current) use of insulin: Secondary | ICD-10-CM | POA: Diagnosis not present

## 2014-03-22 DIAGNOSIS — F22 Delusional disorders: Secondary | ICD-10-CM | POA: Diagnosis not present

## 2014-03-22 DIAGNOSIS — F32A Depression, unspecified: Secondary | ICD-10-CM

## 2014-03-22 DIAGNOSIS — F600 Paranoid personality disorder: Secondary | ICD-10-CM | POA: Insufficient documentation

## 2014-03-22 DIAGNOSIS — F333 Major depressive disorder, recurrent, severe with psychotic symptoms: Secondary | ICD-10-CM | POA: Diagnosis not present

## 2014-03-22 DIAGNOSIS — E1165 Type 2 diabetes mellitus with hyperglycemia: Secondary | ICD-10-CM | POA: Diagnosis not present

## 2014-03-22 DIAGNOSIS — F23 Brief psychotic disorder: Secondary | ICD-10-CM

## 2014-03-22 LAB — COMPREHENSIVE METABOLIC PANEL
ALT: 19 U/L (ref 0–35)
ANION GAP: 16 — AB (ref 5–15)
AST: 15 U/L (ref 0–37)
Albumin: 3.8 g/dL (ref 3.5–5.2)
Alkaline Phosphatase: 157 U/L — ABNORMAL HIGH (ref 39–117)
BUN: 21 mg/dL (ref 6–23)
CO2: 22 mEq/L (ref 19–32)
Calcium: 9.4 mg/dL (ref 8.4–10.5)
Chloride: 98 mEq/L (ref 96–112)
Creatinine, Ser: 0.84 mg/dL (ref 0.50–1.10)
GFR calc Af Amer: 86 mL/min — ABNORMAL LOW (ref >90)
GFR calc non Af Amer: 75 mL/min — ABNORMAL LOW (ref >90)
Glucose, Bld: 235 mg/dL — ABNORMAL HIGH (ref 70–99)
Potassium: 4.5 mEq/L (ref 3.7–5.3)
Sodium: 136 mEq/L — ABNORMAL LOW (ref 137–147)
TOTAL PROTEIN: 7.8 g/dL (ref 6.0–8.3)
Total Bilirubin: <0.2 mg/dL — ABNORMAL LOW (ref 0.3–1.2)

## 2014-03-22 LAB — CBC WITH DIFFERENTIAL/PLATELET
Basophils Absolute: 0.1 10*3/uL (ref 0.0–0.1)
Basophils Relative: 1 % (ref 0–1)
Eosinophils Absolute: 0.3 10*3/uL (ref 0.0–0.7)
Eosinophils Relative: 3 % (ref 0–5)
HCT: 36.3 % (ref 36.0–46.0)
Hemoglobin: 12.5 g/dL (ref 12.0–15.0)
LYMPHS ABS: 3 10*3/uL (ref 0.7–4.0)
Lymphocytes Relative: 28 % (ref 12–46)
MCH: 29.3 pg (ref 26.0–34.0)
MCHC: 34.4 g/dL (ref 30.0–36.0)
MCV: 85.2 fL (ref 78.0–100.0)
MONOS PCT: 5 % (ref 3–12)
Monocytes Absolute: 0.6 10*3/uL (ref 0.1–1.0)
Neutro Abs: 6.8 10*3/uL (ref 1.7–7.7)
Neutrophils Relative %: 63 % (ref 43–77)
Platelets: 284 10*3/uL (ref 150–400)
RBC: 4.26 MIL/uL (ref 3.87–5.11)
RDW: 13.1 % (ref 11.5–15.5)
WBC: 10.8 10*3/uL — ABNORMAL HIGH (ref 4.0–10.5)

## 2014-03-22 LAB — RAPID URINE DRUG SCREEN, HOSP PERFORMED
AMPHETAMINES: NOT DETECTED
Barbiturates: NOT DETECTED
Benzodiazepines: NOT DETECTED
Cocaine: NOT DETECTED
OPIATES: NOT DETECTED
Tetrahydrocannabinol: NOT DETECTED

## 2014-03-22 LAB — ETHANOL

## 2014-03-22 LAB — CBG MONITORING, ED: Glucose-Capillary: 216 mg/dL — ABNORMAL HIGH (ref 70–99)

## 2014-03-22 MED ORDER — INSULIN ASPART 100 UNIT/ML ~~LOC~~ SOLN
0.0000 [IU] | Freq: Three times a day (TID) | SUBCUTANEOUS | Status: DC
  Administered 2014-03-23: 2 [IU] via SUBCUTANEOUS
  Administered 2014-03-23: 3 [IU] via SUBCUTANEOUS
  Filled 2014-03-22 (×2): qty 1

## 2014-03-22 NOTE — ED Notes (Signed)
Pt. reports feeling depressed with auditory hallucinations / delusions for the past several weeks  , denies suicidal ideation . Pt. mildly agitated / tearful during encounter .

## 2014-03-22 NOTE — ED Provider Notes (Signed)
CSN: [id number]     Arrival date & time 03/22/14  1945 History   First MD Initiated Contact with Patient 03/22/14 2226     Chief Complaint  Patient presents with  . Depression  . Hallucinations     (Consider location/radiation/quality/duration/timing/severity/associated sxs/prior Treatment) HPI Patient Reports that her upstairs neighbor has been staring at her and talking about several months. Causing her to become depressed and anxious. She denies suicidal ideations. Denies point to harm others. She does feel incapacitated as result of her anxiety and depression. She last saw Dr.Plovsky 3 months ago for this issue. No adjustment of medications made. No other associated symptoms. She has attempted suicide twice in the past as a result of anxiety and depression. No treatment prior to coming here. Nothing makes symptoms better or worse.   Past Medical History  Diagnosis Date  . Diabetes mellitus without complication   . Hypertension   . High cholesterol   . Depression   . GERD (gastroesophageal reflux disease)    Past Surgical History  Procedure Laterality Date  . Shoulder surgery    . Tonsillectomy    . Cholecystectomy    . Abdominal hysterectomy     No family history on file. History  Substance Use Topics  . Smoking status: Never Smoker   . Smokeless tobacco: Not on file  . Alcohol Use: No   OB History   Grav Para Term Preterm Abortions TAB SAB Ect Mult Living                 Review of Systems  Constitutional: Negative.   HENT: Negative.   Respiratory: Negative.   Cardiovascular: Negative.   Gastrointestinal: Negative.   Musculoskeletal: Negative.   Skin: Negative.   Neurological: Negative.   Psychiatric/Behavioral: Positive for dysphoric mood.  All other systems reviewed and are negative.     Allergies  Review of patient's allergies indicates no known allergies.  Home Medications   Prior to Admission medications   Medication Sig Start Date End Date  Taking? Authorizing Provider  aspirin EC 81 MG tablet Take 162 mg by mouth daily.    Historical Provider, MD  buPROPion (WELLBUTRIN XL) 150 MG 24 hr tablet Take 300 mg by mouth every evening.    Historical Provider, MD  carbamazepine (TEGRETOL) 200 MG tablet Take 200 mg by mouth 2 (two) times daily.    Historical Provider, MD  cetirizine (ZYRTEC) 10 MG tablet Take 10 mg by mouth daily.    Historical Provider, MD  diphenhydrAMINE (BENADRYL) 25 MG tablet Take 12.5 mg by mouth at bedtime as needed. For sleep    Historical Provider, MD  fenofibrate 160 MG tablet Take 160 mg by mouth daily.    Historical Provider, MD  glimepiride (AMARYL) 4 MG tablet Take 4 mg by mouth daily with breakfast.    Historical Provider, MD  insulin aspart protamine- aspart (NOVOLOG MIX 70/30) (70-30) 100 UNIT/ML injection Inject 20 Units into the skin 2 (two) times daily with a meal.    Historical Provider, MD  insulin glargine (LANTUS) 100 UNIT/ML injection Inject 0.2 mLs (20 Units total) into the skin daily. 07/18/13   Kandice Hams, MD  Insulin Pen Starter Kit MISC 1 kit by Other route once. 07/18/13   Kandice Hams, MD  lisinopril (PRINIVIL,ZESTRIL) 20 MG tablet Take 20 mg by mouth daily.    Historical Provider, MD  LORazepam (ATIVAN) 0.5 MG tablet Take 0.5-1 mg by mouth daily as needed for anxiety.  Historical Provider, MD  omeprazole (PRILOSEC OTC) 20 MG tablet Take 20 mg by mouth daily.    Historical Provider, MD  PARoxetine (PAXIL) 40 MG tablet Take 60 mg by mouth every morning.    Historical Provider, MD  simvastatin (ZOCOR) 20 MG tablet Take 1 tablet (20 mg total) by mouth daily. 07/18/13   Kandice Hams, MD   BP 163/94  Pulse 120  Temp(Src) 98.5 F (36.9 C) (Oral)  Resp 18  Ht $R'5\' 4"'Qx$  (1.626 m)  Wt 190 lb (86.183 kg)  BMI 32.60 kg/m2  SpO2 96% Physical Exam  Nursing note and vitals reviewed. Constitutional: She is oriented to person, place, and time. She appears well-developed and well-nourished.  HENT:   Head: Normocephalic and atraumatic.  Eyes: Conjunctivae are normal. Pupils are equal, round, and reactive to light.  Neck: Neck supple. No tracheal deviation present. No thyromegaly present.  Cardiovascular: Normal rate and regular rhythm.   No murmur heard. Pulmonary/Chest: Effort normal and breath sounds normal.  Abdominal: Soft. Bowel sounds are normal. She exhibits no distension. There is no tenderness.  Musculoskeletal: Normal range of motion. She exhibits no edema and no tenderness.  Neurological: She is alert and oriented to person, place, and time. No cranial nerve deficit. Coordination normal.  Glasgow Coma Score 15  Skin: Skin is warm and dry. No rash noted.  Psychiatric:  Mildly anxious    ED Course  Procedures (including critical care time) Labs Review Labs Reviewed  CBC WITH DIFFERENTIAL - Abnormal; Notable for the following:    WBC 10.8 (*)    All other components within normal limits  COMPREHENSIVE METABOLIC PANEL - Abnormal; Notable for the following:    Sodium 136 (*)    Glucose, Bld 235 (*)    Alkaline Phosphatase 157 (*)    Total Bilirubin <0.2 (*)    GFR calc non Af Amer 75 (*)    GFR calc Af Amer 86 (*)    Anion gap 16 (*)    All other components within normal limits  CBG MONITORING, ED - Abnormal; Notable for the following:    Glucose-Capillary 216 (*)    All other components within normal limits  ETHANOL  URINE RAPID DRUG SCREEN (HOSP PERFORMED)  CBG MONITORING, ED    Imaging Review No results found.   EKG Interpretation None     Results for orders placed during the hospital encounter of 03/22/14  ETHANOL      Result Value Ref Range   Alcohol, Ethyl (B) <11  0 - 11 mg/dL  URINE RAPID DRUG SCREEN (HOSP PERFORMED)      Result Value Ref Range   Opiates NONE DETECTED  NONE DETECTED   Cocaine NONE DETECTED  NONE DETECTED   Benzodiazepines NONE DETECTED  NONE DETECTED   Amphetamines NONE DETECTED  NONE DETECTED   Tetrahydrocannabinol NONE  DETECTED  NONE DETECTED   Barbiturates NONE DETECTED  NONE DETECTED  CBC WITH DIFFERENTIAL      Result Value Ref Range   WBC 10.8 (*) 4.0 - 10.5 K/uL   RBC 4.26  3.87 - 5.11 MIL/uL   Hemoglobin 12.5  12.0 - 15.0 g/dL   HCT 36.3  36.0 - 46.0 %   MCV 85.2  78.0 - 100.0 fL   MCH 29.3  26.0 - 34.0 pg   MCHC 34.4  30.0 - 36.0 g/dL   RDW 13.1  11.5 - 15.5 %   Platelets 284  150 - 400 K/uL   Neutrophils Relative %  63  43 - 77 %   Neutro Abs 6.8  1.7 - 7.7 K/uL   Lymphocytes Relative 28  12 - 46 %   Lymphs Abs 3.0  0.7 - 4.0 K/uL   Monocytes Relative 5  3 - 12 %   Monocytes Absolute 0.6  0.1 - 1.0 K/uL   Eosinophils Relative 3  0 - 5 %   Eosinophils Absolute 0.3  0.0 - 0.7 K/uL   Basophils Relative 1  0 - 1 %   Basophils Absolute 0.1  0.0 - 0.1 K/uL  COMPREHENSIVE METABOLIC PANEL      Result Value Ref Range   Sodium 136 (*) 137 - 147 mEq/L   Potassium 4.5  3.7 - 5.3 mEq/L   Chloride 98  96 - 112 mEq/L   CO2 22  19 - 32 mEq/L   Glucose, Bld 235 (*) 70 - 99 mg/dL   BUN 21  6 - 23 mg/dL   Creatinine, Ser 0.84  0.50 - 1.10 mg/dL   Calcium 9.4  8.4 - 10.5 mg/dL   Total Protein 7.8  6.0 - 8.3 g/dL   Albumin 3.8  3.5 - 5.2 g/dL   AST 15  0 - 37 U/L   ALT 19  0 - 35 U/L   Alkaline Phosphatase 157 (*) 39 - 117 U/L   Total Bilirubin <0.2 (*) 0.3 - 1.2 mg/dL   GFR calc non Af Amer 75 (*) >90 mL/min   GFR calc Af Amer 86 (*) >90 mL/min   Anion gap 16 (*) 5 - 15  CBG MONITORING, ED      Result Value Ref Range   Glucose-Capillary 216 (*) 70 - 99 mg/dL   No results found.  MDM  TTS consultation ordered. Patient may need adjustment in her medications. She will be here overnight and be evaluated by a psychiatrist tomorrow morning Final diagnoses:  None   Dx #1 paranoid delusions #2hyperglycemia      Orlie Dakin, MD 03/23/14 0105

## 2014-03-23 ENCOUNTER — Observation Stay (HOSPITAL_COMMUNITY)
Admission: AD | Admit: 2014-03-23 | Discharge: 2014-03-24 | Disposition: A | Discharge status: Home / Self Care | Payer: Medicare Other | Source: Intra-hospital | Attending: Psychiatry | Admitting: Psychiatry

## 2014-03-23 DIAGNOSIS — F332 Major depressive disorder, recurrent severe without psychotic features: Secondary | ICD-10-CM | POA: Diagnosis not present

## 2014-03-23 DIAGNOSIS — K219 Gastro-esophageal reflux disease without esophagitis: Secondary | ICD-10-CM | POA: Insufficient documentation

## 2014-03-23 DIAGNOSIS — F419 Anxiety disorder, unspecified: Secondary | ICD-10-CM | POA: Insufficient documentation

## 2014-03-23 DIAGNOSIS — Z7982 Long term (current) use of aspirin: Secondary | ICD-10-CM | POA: Diagnosis not present

## 2014-03-23 DIAGNOSIS — Z79899 Other long term (current) drug therapy: Secondary | ICD-10-CM | POA: Diagnosis not present

## 2014-03-23 DIAGNOSIS — F600 Paranoid personality disorder: Secondary | ICD-10-CM | POA: Insufficient documentation

## 2014-03-23 DIAGNOSIS — F329 Major depressive disorder, single episode, unspecified: Secondary | ICD-10-CM | POA: Diagnosis present

## 2014-03-23 DIAGNOSIS — Z794 Long term (current) use of insulin: Secondary | ICD-10-CM | POA: Diagnosis not present

## 2014-03-23 DIAGNOSIS — F333 Major depressive disorder, recurrent, severe with psychotic symptoms: Secondary | ICD-10-CM | POA: Diagnosis not present

## 2014-03-23 DIAGNOSIS — E119 Type 2 diabetes mellitus without complications: Secondary | ICD-10-CM | POA: Insufficient documentation

## 2014-03-23 DIAGNOSIS — F22 Delusional disorders: Secondary | ICD-10-CM

## 2014-03-23 DIAGNOSIS — Z888 Allergy status to other drugs, medicaments and biological substances status: Secondary | ICD-10-CM | POA: Insufficient documentation

## 2014-03-23 DIAGNOSIS — F331 Major depressive disorder, recurrent, moderate: Secondary | ICD-10-CM

## 2014-03-23 DIAGNOSIS — I10 Essential (primary) hypertension: Secondary | ICD-10-CM | POA: Insufficient documentation

## 2014-03-23 LAB — GLUCOSE, CAPILLARY
GLUCOSE-CAPILLARY: 345 mg/dL — AB (ref 70–99)
Glucose-Capillary: 245 mg/dL — ABNORMAL HIGH (ref 70–99)

## 2014-03-23 LAB — CBG MONITORING, ED
Glucose-Capillary: 167 mg/dL — ABNORMAL HIGH (ref 70–99)
Glucose-Capillary: 206 mg/dL — ABNORMAL HIGH (ref 70–99)

## 2014-03-23 MED ORDER — FENOFIBRATE 160 MG PO TABS
160.0000 mg | ORAL_TABLET | Freq: Every day | ORAL | Status: DC
  Administered 2014-03-23: 160 mg via ORAL
  Filled 2014-03-23: qty 1

## 2014-03-23 MED ORDER — CARBAMAZEPINE 200 MG PO TABS
200.0000 mg | ORAL_TABLET | Freq: Two times a day (BID) | ORAL | Status: DC
  Administered 2014-03-23 – 2014-03-24 (×2): 200 mg via ORAL
  Filled 2014-03-23 (×6): qty 1

## 2014-03-23 MED ORDER — PAROXETINE HCL 30 MG PO TABS
60.0000 mg | ORAL_TABLET | Freq: Every day | ORAL | Status: DC
  Administered 2014-03-23: 60 mg via ORAL
  Filled 2014-03-23 (×2): qty 2

## 2014-03-23 MED ORDER — ASPIRIN EC 81 MG PO TBEC
81.0000 mg | DELAYED_RELEASE_TABLET | Freq: Every day | ORAL | Status: DC
  Administered 2014-03-23: 81 mg via ORAL
  Filled 2014-03-23: qty 1

## 2014-03-23 MED ORDER — OMEPRAZOLE MAGNESIUM 20 MG PO TBEC: 20.0000 mg | DELAYED_RELEASE_TABLET | Freq: Every day | ORAL | Status: DC

## 2014-03-23 MED ORDER — SIMVASTATIN 20 MG PO TABS
20.0000 mg | ORAL_TABLET | Freq: Every day | ORAL | Status: DC
  Administered 2014-03-24: 20 mg via ORAL
  Filled 2014-03-23 (×3): qty 1

## 2014-03-23 MED ORDER — HYDROXYZINE HCL 25 MG PO TABS
25.0000 mg | ORAL_TABLET | Freq: Four times a day (QID) | ORAL | Status: DC | PRN
  Administered 2014-03-23: 25 mg via ORAL
  Filled 2014-03-23: qty 1

## 2014-03-23 MED ORDER — INSULIN ASPART PROT & ASPART (70-30 MIX) 100 UNIT/ML ~~LOC~~ SUSP
25.0000 [IU] | Freq: Two times a day (BID) | SUBCUTANEOUS | Status: DC
  Administered 2014-03-24: 25 [IU] via SUBCUTANEOUS

## 2014-03-23 MED ORDER — INSULIN ASPART 100 UNIT/ML ~~LOC~~ SOLN
0.0000 [IU] | Freq: Every day | SUBCUTANEOUS | Status: DC
  Administered 2014-03-23: 4 [IU] via SUBCUTANEOUS

## 2014-03-23 MED ORDER — CARBAMAZEPINE 200 MG PO TABS
200.0000 mg | ORAL_TABLET | Freq: Two times a day (BID) | ORAL | Status: DC
  Administered 2014-03-23 (×2): 200 mg via ORAL
  Filled 2014-03-23 (×3): qty 1

## 2014-03-23 MED ORDER — FENOFIBRATE 160 MG PO TABS
160.0000 mg | ORAL_TABLET | Freq: Every day | ORAL | Status: DC
  Administered 2014-03-24: 160 mg via ORAL
  Filled 2014-03-23 (×2): qty 1

## 2014-03-23 MED ORDER — LISINOPRIL 20 MG PO TABS
20.0000 mg | ORAL_TABLET | Freq: Every day | ORAL | Status: DC
  Administered 2014-03-23: 20 mg via ORAL
  Filled 2014-03-23: qty 1

## 2014-03-23 MED ORDER — INSULIN ASPART 100 UNIT/ML ~~LOC~~ SOLN
0.0000 [IU] | Freq: Three times a day (TID) | SUBCUTANEOUS | Status: DC
  Administered 2014-03-24 (×2): 4 [IU] via SUBCUTANEOUS

## 2014-03-23 MED ORDER — BUPROPION HCL ER (XL) 300 MG PO TB24
300.0000 mg | ORAL_TABLET | Freq: Every evening | ORAL | Status: DC
  Filled 2014-03-23: qty 1

## 2014-03-23 MED ORDER — LISINOPRIL 20 MG PO TABS
20.0000 mg | ORAL_TABLET | Freq: Every day | ORAL | Status: DC
  Administered 2014-03-24: 20 mg via ORAL
  Filled 2014-03-23 (×3): qty 1

## 2014-03-23 MED ORDER — ASPIRIN EC 81 MG PO TBEC
81.0000 mg | DELAYED_RELEASE_TABLET | Freq: Every day | ORAL | Status: DC
  Administered 2014-03-24: 81 mg via ORAL
  Filled 2014-03-23 (×3): qty 1

## 2014-03-23 MED ORDER — ALUM & MAG HYDROXIDE-SIMETH 200-200-20 MG/5ML PO SUSP: 30.0000 mL | ORAL | Status: DC | PRN

## 2014-03-23 MED ORDER — LORAZEPAM 1 MG PO TABS
0.5000 mg | ORAL_TABLET | Freq: Every day | ORAL | Status: DC | PRN
  Administered 2014-03-23: 1 mg via ORAL
  Filled 2014-03-23: qty 1

## 2014-03-23 MED ORDER — PAROXETINE HCL 30 MG PO TABS
60.0000 mg | ORAL_TABLET | Freq: Every day | ORAL | Status: DC
  Administered 2014-03-23: 60 mg via ORAL
  Filled 2014-03-23 (×2): qty 2
  Filled 2014-03-23: qty 6

## 2014-03-23 MED ORDER — OMEPRAZOLE 20 MG PO CPDR
20.0000 mg | DELAYED_RELEASE_CAPSULE | Freq: Every day | ORAL | Status: DC
  Administered 2014-03-24: 20 mg via ORAL
  Filled 2014-03-23 (×2): qty 1

## 2014-03-23 MED ORDER — TRAZODONE HCL 50 MG PO TABS
50.0000 mg | ORAL_TABLET | Freq: Every evening | ORAL | Status: DC | PRN
  Administered 2014-03-23: 50 mg via ORAL
  Filled 2014-03-23 (×5): qty 1

## 2014-03-23 MED ORDER — BUPROPION HCL ER (XL) 300 MG PO TB24
300.0000 mg | ORAL_TABLET | Freq: Every evening | ORAL | Status: DC
  Administered 2014-03-23: 300 mg via ORAL
  Filled 2014-03-23 (×4): qty 1

## 2014-03-23 MED ORDER — SIMVASTATIN 20 MG PO TABS
20.0000 mg | ORAL_TABLET | Freq: Every day | ORAL | Status: DC
  Administered 2014-03-23: 20 mg via ORAL
  Filled 2014-03-23: qty 1

## 2014-03-23 MED ORDER — LORATADINE 10 MG PO TABS
10.0000 mg | ORAL_TABLET | Freq: Every day | ORAL | Status: DC
  Administered 2014-03-24: 10 mg via ORAL
  Filled 2014-03-23 (×3): qty 1

## 2014-03-23 MED ORDER — PANTOPRAZOLE SODIUM 40 MG PO TBEC
40.0000 mg | DELAYED_RELEASE_TABLET | Freq: Every day | ORAL | Status: DC
  Administered 2014-03-23: 40 mg via ORAL
  Filled 2014-03-23: qty 1

## 2014-03-23 MED ORDER — ACETAMINOPHEN 325 MG PO TABS: 650.0000 mg | ORAL_TABLET | Freq: Four times a day (QID) | ORAL | Status: DC | PRN

## 2014-03-23 MED ORDER — MAGNESIUM HYDROXIDE 400 MG/5ML PO SUSP: 30.0000 mL | Freq: Every day | ORAL | Status: DC | PRN

## 2014-03-23 NOTE — Progress Notes (Signed)
Patient ID: Wendy MercuryCynthia K Poorman, female   DOB: 06-Jan-1956, 58 y.o.   MRN: [id number] Admission Note-[facility],since she has been believing that her upstairs neighbors are spying or watching her. She states this has been going on for some time and it happened to her years ago, then she and her husband moved and it stopped for 7 years. She and husband can not afford to move now, so today when he was visiting with her they discussed strategies for making her feel better and safer. She states no one believes her but its real. She has a long history of depression and anxiety, she is a patient of Dr Donell BeersPlovsky and states her mood is currently good, but the anxiety continues to be a problem. She denies thoughts to hurt self or others. No property brought over with her. CBG done on admission and ate dinner. Oriented to unit. Pleasant and cooperative.

## 2014-03-23 NOTE — Progress Notes (Signed)
Pt has been assessed by TTS and recommendation for pt [facility]. Pod C, RN made aware. Pt to be transported,accepting physician Dr. Lucianne MussKumar.  Derrell Lollingoris Arshi Duarte, MSW Clinical Social Worker [phone]

## 2014-03-23 NOTE — BH Assessment (Signed)
Assessment completed. Consulted Nanine MeansJamison Lord, NP who recommended a psychiatric evaluation in the morning. Dr. Ethelda ChickJacubowitz, MD has been informed of the recommendation.
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
## 2014-03-23 NOTE — ED Notes (Signed)
[facility] for [facility].

## 2014-03-23 NOTE — ED Notes (Signed)
TTS in progress at this time.  

## 2014-03-23 NOTE — Progress Notes (Signed)
[facility] INPATIENT:  Family/Significant Other Suicide Prevention Education  Suicide Prevention Education:  Patient Refusal for Family/Significant Other Suicide Prevention Education: The patient Wendy MercuryCynthia K Schlag has refused to provide written consent for family/significant other to be provided Family/Significant Other Suicide Prevention Education during admission and/or prior to discharge.  Physician notified.  Wynona LunaBeck, Mlissa Tamayo K 03/23/2014, 5:56 PM

## 2014-03-23 NOTE — ED Provider Notes (Signed)
Psych team [facility], Dr Lucianne MussKumar.  Pt alert, content. Nad.   Filed Vitals:   03/23/14 1248  BP: 112/62  Pulse: 97  Temp:   Resp: 16   Pt currently appears stable for transfer [facility].     Suzi RootsKevin E Alexcia Schools, MD 03/23/14 1250

## 2014-03-23 NOTE — Progress Notes (Signed)
Pt alert, oriented and cooperative. Affect /mood sad but brightens on approach. -SI/HI, -A/[place], Verbally contracts for safety. Denies pain or discomfort. Will continue to monitor closely and evaluate for stabilization.

## 2014-03-23 NOTE — Discharge Instructions (Signed)
Transfer to [facility] observation area. ° ° ° °
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
## 2014-03-24 DIAGNOSIS — F22 Delusional disorders: Secondary | ICD-10-CM

## 2014-03-24 DIAGNOSIS — I10 Essential (primary) hypertension: Secondary | ICD-10-CM | POA: Diagnosis not present

## 2014-03-24 DIAGNOSIS — F332 Major depressive disorder, recurrent severe without psychotic features: Secondary | ICD-10-CM

## 2014-03-24 DIAGNOSIS — E119 Type 2 diabetes mellitus without complications: Secondary | ICD-10-CM | POA: Diagnosis not present

## 2014-03-24 DIAGNOSIS — F419 Anxiety disorder, unspecified: Secondary | ICD-10-CM | POA: Diagnosis not present

## 2014-03-24 LAB — GLUCOSE, CAPILLARY
Glucose-Capillary: 182 mg/dL — ABNORMAL HIGH (ref 70–99)
Glucose-Capillary: 155 mg/dL — ABNORMAL HIGH (ref 70–99)

## 2014-03-24 MED ORDER — CETIRIZINE HCL 10 MG PO TABS: 10.0000 mg | ORAL_TABLET | Freq: Every day | ORAL | Status: AC

## 2014-03-24 MED ORDER — BUPROPION HCL ER (XL) 150 MG PO TB24: 300.0000 mg | ORAL_TABLET | Freq: Every evening | ORAL | Status: DC

## 2014-03-24 MED ORDER — OMEPRAZOLE MAGNESIUM 20 MG PO TBEC: 20.0000 mg | DELAYED_RELEASE_TABLET | Freq: Every day | ORAL | Status: AC

## 2014-03-24 MED ORDER — LISINOPRIL 20 MG PO TABS: 20.0000 mg | ORAL_TABLET | Freq: Every day | ORAL | Status: AC

## 2014-03-24 MED ORDER — PAROXETINE HCL 40 MG PO TABS: 60.0000 mg | ORAL_TABLET | Freq: Every day | ORAL | Status: DC

## 2014-03-24 MED ORDER — CARBAMAZEPINE 200 MG PO TABS: 200.0000 mg | ORAL_TABLET | Freq: Two times a day (BID) | ORAL | Status: DC

## 2014-03-24 MED ORDER — INSULIN ASPART PROT & ASPART (70-30 MIX) 100 UNIT/ML ~~LOC~~ SUSP: 25.0000 [IU] | Freq: Two times a day (BID) | SUBCUTANEOUS | Status: DC

## 2014-03-24 MED ORDER — QUETIAPINE FUMARATE 25 MG PO TABS: 25.0000 mg | ORAL_TABLET | Freq: Two times a day (BID) | ORAL | Status: DC

## 2014-03-24 MED ORDER — TRAZODONE HCL 50 MG PO TABS: 50.0000 mg | ORAL_TABLET | Freq: Every evening | ORAL | Status: DC | PRN

## 2014-03-24 MED ORDER — QUETIAPINE FUMARATE 25 MG PO TABS
25.0000 mg | ORAL_TABLET | Freq: Two times a day (BID) | ORAL | Status: DC
  Administered 2014-03-24: 25 mg via ORAL
  Filled 2014-03-24 (×6): qty 1

## 2014-03-24 MED ORDER — FENOFIBRATE 160 MG PO TABS: 160.0000 mg | ORAL_TABLET | Freq: Every day | ORAL | Status: DC

## 2014-03-24 MED ORDER — SIMVASTATIN 20 MG PO TABS: 20.0000 mg | ORAL_TABLET | Freq: Every day | ORAL | Status: AC

## 2014-03-24 NOTE — Consult Note (Signed)
[facility]   Subjective: Pt seen and chart reviewed. Pt's husband is present. Pt and husband report that the pt has had ongoing paranoid ideation x 5 yrs with recent severe exacerbation and anxiety and that her MD told the pt to come into the hospital for help. Pt reports that she is not suicidal or homicidal but is very upset about these delusions and that they are increasing without relief. Per this NP and Dr. Lucianne MussKumar, pt to come to OBS unit to initiate a low dose of Seroquel (after a 12-lead EKG in ED, as communicated to EDP). Pt will spend the night in OBS to determine efficacy of medication while TTS staff works on outpatient followup planning with patient.   HPI:  [name] is an 58 y.o. female  presenting to [facility] ED reporting an increase in depression and anxiety. 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 Pt stated "I no longer want to live in my home and my husband does not believe me". Pt reported that tonight she could no longer take it so she came to the ED. Pt stated 'I feel trapped". Pt reported that the men are looking down at her when she showers and go to the bathroom. Pt also stated "I think that they watch me when I'm asleep". Pt reported that they drop wires and make small cuts where the ceiling meets the walls so that they could put in small camera. Pt reported that she has noticed multiple men exiting the condo with camera equipment. Pt shared her neighbors watch her when her husband is not at home. Pt stated "I can hear them putting wires in the walls and pulling up the floor boards. PT shared that she has contacted the condo to have someone look through her condo but they did not find any suspect wires.  Pt denies SI, HI and AVH at this time. PT reported that she has attempted suicide in the past. Pt also reported that she has been hospitalized due to her depression and suicide attempts. PT reported that she is currently  receiving medication management and that she is compliant with her medication. Pt is endorsing multiple depressive symptoms and reported that her sleep and appetite are fine. Pt reported that there are times when she will binge it. PT denied having access to firearms and did not report any pending criminal charges or upcoming court dates. PT did not report any illicit substance or alcohol use. PT did not report any physical, sexual or emotional abuse.  Pt is alert and oriented x3. Pt is calm and cooperative at this time. Pt maintained good eye contact throughout the assessment and her motor activity appear within normal limits. Pt speech is normal and her thought process is coherent and relevant. Pt judgment and insight is fair. Pt mood is pleasant and affect is congruent to mood. It is recommended that pt be re-evaluated by psychiatry in the morning.   Axis I: Major Depression, Recurrent severe with paranoid features Axis II: Deferred Axis III:  Past Medical History  Diagnosis Date  . Diabetes mellitus without complication   . Hypertension   . High cholesterol   . Depression   . GERD (gastroesophageal reflux disease)    Axis IV: other psychosocial or environmental problems and problems related to social environment Axis V: 51-60 moderate symptoms  General Appearance: Casual  Eye Contact:: Good  Speech: Clear and Coherent  Volume: Normal  Mood: Anxious  Affect: Appropriate and Congruent  Thought Process: Coherent and Goal Directed  Orientation: Full (Time, Place, and Person)  Thought Content: rumination about people watching her with cameras Suicidal Thoughts: No  Homicidal Thoughts: No Memory: Immediate; Fair  Recent; Fair  Remote; Fair  Judgement: Fair  Insight: Fair  Psychomotor Activity: Normal  Concentration: Good  Recall: Good  Akathisia: No  Handed:  AIMS (if indicated): Assets: Communication Skills  Desire for Improvement  Resilience  Sleep:     Past Surgical  History  Procedure Laterality Date  . Shoulder surgery    . Tonsillectomy    . Cholecystectomy    . Abdominal hysterectomy      Family History: No family history on file.  Social History:  reports that she has never smoked. She does not have any smokeless tobacco history on file. She reports that she does not drink alcohol or use illicit drugs.  Additional Social History:  Alcohol / Drug Use Pain Medications: not abusing Prescriptions: not abusing Over the Counter: not abusing History of alcohol / drug use?: No history of alcohol / drug abuse  CIWA: CIWA-Ar BP: 118/68 mmHg Pulse Rate: 102 COWS:    PATIENT STRENGTHS: (choose at least two) Average or above average intelligence Supportive family/friends  Allergies:  Allergies  Allergen Reactions  . Tradjenta [Linagliptin] Other (See Comments)    Hx of pancreatitis    Home Medications:  Medications Prior to Admission  Medication Sig Dispense Refill  . aspirin EC 81 MG tablet Take 81 mg by mouth daily.       Marland Kitchen. buPROPion (WELLBUTRIN XL) 150 MG 24 hr tablet Take 300 mg by mouth every evening.      . carbamazepine (TEGRETOL) 200 MG tablet Take 200 mg by mouth 2 (two) times daily.      . cetirizine (ZYRTEC) 10 MG tablet Take 10 mg by mouth daily.      . diphenhydrAMINE (BENADRYL) 25 MG tablet Take 12.5 mg by mouth at bedtime as needed. For sleep      . fenofibrate 160 MG tablet Take 160 mg by mouth daily.      . insulin aspart protamine- aspart (NOVOLOG MIX 70/30) (70-30) 100 UNIT/ML injection Inject 25 Units into the skin 2 (two) times daily with a meal.      . lisinopril (PRINIVIL,ZESTRIL) 20 MG tablet Take 20 mg by mouth daily.      Marland Kitchen. LORazepam (ATIVAN) 0.5 MG tablet Take 0.5-1 mg by mouth daily as needed for anxiety.      Marland Kitchen. omeprazole (PRILOSEC OTC) 20 MG tablet Take 20 mg by mouth daily.      Marland Kitchen. PARoxetine (PAXIL) 40 MG tablet Take 60 mg by mouth at bedtime.      . simvastatin (ZOCOR) 20 MG tablet Take 1 tablet (20 mg  total) by mouth daily.  30 tablet  6      Disposition: Discharge home with outpatient resources for psychiatry/counseling.     Beau FannyWithrow, Ersilia Brawley C, FNP-BC 03/23/2014 10:05 AM

## 2014-03-24 NOTE — Discharge Summary (Signed)
[facility] OBS UNIT DISCHARGE SUMMARY   Subjective: Pt seen and chart reviewed. Pt denies SI, HI, and AVH, contracts for safety.   HPI:  [name] is an 58 y.o. female  [facility] reporting an increase in depression and anxiety. Pt stated "I have been having problems with voyeurs living over me for the past 5 years, the men are looking down at her when I shower and go to the bathroom. Pt also stated "I think that they watch me when I'm asleep".  She has reported  suicide attempts in the past.  [name] has been hospitalized in the past due to herdepression and suicide attempts.  Reported that she is currently receiving medication management and that she is compliant with her medication.  She denied access to firearms, any pending criminal charges or upcoming court dates.  She did not endorse any illicit substance or alcohol use nor report any physical, sexual or emotional abuse.   Axis I: Major Depression, Recurrent severe with paranoid features Axis II: Deferred Axis III:  Past Medical History  Diagnosis Date  . Diabetes mellitus without complication   . Hypertension   . High cholesterol   . Depression   . GERD (gastroesophageal reflux disease)    Axis IV: other psychosocial or environmental problems and problems related to social environment Axis V: 51-60 moderate symptoms  Psychiatric Specialty Exam: Physical Exam Full Physical Exam performed in ED; reviewed, stable, and I concur with this assessment.   ROS  Blood pressure 118/68, pulse 102, temperature 98.3 F (36.8 C), temperature source Oral, resp. rate 18, height 5\' 4"  (1.626 m), weight 86.183 kg (190 lb).Body mass index is 32.6 kg/(m^2).  General Appearance: Casual  Eye Contact::  Good  Speech:  Clear and Coherent  Volume:  Normal  Mood:  Anxious  Affect:  Appropriate and Congruent  Thought Process:  Coherent and Goal Directed  Orientation:  Full (Time, Place, and Person)  Thought Content:  WDL  Suicidal Thoughts:   No  Homicidal Thoughts:  No  Memory:  Immediate;   Fair Recent;   Fair Remote;   Fair  Judgement:  Fair  Insight:  Fair  Psychomotor Activity:  Normal  Concentration:  Good  Recall:  Good  Akathisia:  No  Handed:    AIMS (if indicated):     Assets:  Communication Skills Desire for Improvement Resilience  Sleep:         Past Surgical History  Procedure Laterality Date  . Shoulder surgery    . Tonsillectomy    . Cholecystectomy    . Abdominal hysterectomy      Family History: No family history on file.  Social History:  reports that she has never smoked. She does not have any smokeless tobacco history on file. She reports that she does not drink alcohol or use illicit drugs.  Additional Social History:  Alcohol / Drug Use Pain Medications: not abusing Prescriptions: not abusing Over the Counter: not abusing History of alcohol / drug use?: No history of alcohol / drug abuse  CIWA: CIWA-Ar BP: 118/68 mmHg Pulse Rate: 102 COWS:    PATIENT STRENGTHS: (choose at least two) Average or above average intelligence Supportive family/friends  Allergies:  Allergies  Allergen Reactions  . Tradjenta [Linagliptin] Other (See Comments)    Hx of pancreatitis    Home Medications:  Medications Prior to Admission  Medication Sig Dispense Refill  . aspirin EC 81 MG tablet Take 81 mg by mouth daily.       .Marland Kitchen  buPROPion (WELLBUTRIN XL) 150 MG 24 hr tablet Take 300 mg by mouth every evening.      . carbamazepine (TEGRETOL) 200 MG tablet Take 200 mg by mouth 2 (two) times daily.      . cetirizine (ZYRTEC) 10 MG tablet Take 10 mg by mouth daily.      . diphenhydrAMINE (BENADRYL) 25 MG tablet Take 12.5 mg by mouth at bedtime as needed. For sleep      . fenofibrate 160 MG tablet Take 160 mg by mouth daily.      . insulin aspart protamine- aspart (NOVOLOG MIX 70/30) (70-30) 100 UNIT/ML injection Inject 25 Units into the skin 2 (two) times daily with a meal.      . lisinopril  (PRINIVIL,ZESTRIL) 20 MG tablet Take 20 mg by mouth daily.      Marland Kitchen. LORazepam (ATIVAN) 0.5 MG tablet Take 0.5-1 mg by mouth daily as needed for anxiety.      Marland Kitchen. omeprazole (PRILOSEC OTC) 20 MG tablet Take 20 mg by mouth daily.      Marland Kitchen. PARoxetine (PAXIL) 40 MG tablet Take 60 mg by mouth at bedtime.      . simvastatin (ZOCOR) 20 MG tablet Take 1 tablet (20 mg total) by mouth daily.  30 tablet  6      Disposition:  -Seroquel 25mg  tid (script for 30 days) -Discharge home with outpatient resources for psychiatry/counseling. [name] assist)   [facility] Surgery[facility]ila May Ranen Doolin, AGNP-BC 03/24/2014 12:45 PM

## 2014-03-24 NOTE — H&P (Signed)
[facility] OBS UNIT H&P   Subjective: Pt seen and chart reviewed. Pt denies SI, HI, and AVH, contracts for safety. Pt continues to report fears/concerns/ideations that people are spying on her and this has been going on for 5 yrs. Pt is not yet sure if the Seroquel is helping but is hopeful that it will. Pt appears to be at baseline and pt and husband both indicate that these ideations do not interfere with her ability to care for herself or others, or to take care of her activities of daily living, nor do they encourage her to be more susceptible to harm herself or others. Pt is open to outpatient followup.   HPI:  [name] is an 58 y.o. female  [facility] reporting an increase in depression and anxiety. 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 Pt stated "I no longer want to live in my home and my husband does not believe me". Pt reported that tonight she could no longer take it so she came to the ED. Pt stated 'I feel trapped". Pt reported that the men are looking down at her when she showers and go to the bathroom. Pt also stated "I think that they watch me when I'm asleep". Pt reported that they drop wires and make small cuts where the ceiling meets the walls so that they could put in small camera. Pt reported that she has noticed multiple men exiting the condo with camera equipment. Pt shared her neighbors watch her when her husband is not at home. Pt stated "I can hear them putting wires in the walls and pulling up the floor boards. PT shared that she has contacted the condo to have someone look through her condo but they did not find any suspect wires. Pt denies SI, HI and AVH at this time. PT reported that she has attempted suicide in the past. Pt also reported that she has been hospitalized due to her depression and suicide attempts. PT reported that she is currently receiving medication management and that she is compliant with her medication.  Pt is endorsing multiple depressive symptoms and reported that her sleep and appetite are fine. Pt reported that there are times when she will binge it. PT denied having access to firearms and did not report any pending criminal charges or upcoming court dates. PT did not report any illicit substance or alcohol use. PT did not report any physical, sexual or emotional abuse. Pt is alert and oriented x3. Pt is calm and cooperative at this time. Pt maintained good eye contact throughout the assessment and her motor activity appear within normal limits. Pt speech is normal and her thought process is coherent and relevant. Pt judgment and insight is fair. Pt mood is pleasant and affect is  congruent to mood. It is recommended that pt be re-evaluated by psychiatry in the morning.   Axis I: Major Depression, Recurrent severe with paranoid features Axis II: Deferred Axis III:  Past Medical History  Diagnosis Date  . Diabetes mellitus without complication   . Hypertension   . High cholesterol   . Depression   . GERD (gastroesophageal reflux disease)    Axis IV: other psychosocial or environmental problems and problems related to social environment Axis V: 51-60 moderate symptoms  Psychiatric Specialty Exam: Physical Exam Full Physical Exam performed in ED; reviewed, stable, and I concur with this assessment.   ROS  Blood pressure 118/68, pulse  102, temperature 98.3 F (36.8 C), temperature source Oral, resp. rate 18, height 5\' 4"  (1.626 m), weight 86.183 kg (190 lb).Body mass index is 32.6 kg/(m^2).  General Appearance: Casual  Eye Contact::  Good  Speech:  Clear and Coherent  Volume:  Normal  Mood:  Anxious  Affect:  Appropriate and Congruent  Thought Process:  Coherent and Goal Directed  Orientation:  Full (Time, Place, and Person)  Thought Content:  WDL  Suicidal Thoughts:  No  Homicidal Thoughts:  No  Memory:  Immediate;   Fair Recent;   Fair Remote;   Fair  Judgement:  Fair  Insight:   Fair  Psychomotor Activity:  Normal  Concentration:  Good  Recall:  Good  Akathisia:  No  Handed:    AIMS (if indicated):     Assets:  Communication Skills Desire for Improvement Resilience  Sleep:         Past Surgical History  Procedure Laterality Date  . Shoulder surgery    . Tonsillectomy    . Cholecystectomy    . Abdominal hysterectomy      Family History: No family history on file.  Social History:  reports that she has never smoked. She does not have any smokeless tobacco history on file. She reports that she does not drink alcohol or use illicit drugs.  Additional Social History:  Alcohol / Drug Use Pain Medications: not abusing Prescriptions: not abusing Over the Counter: not abusing History of alcohol / drug use?: No history of alcohol / drug abuse  CIWA: CIWA-Ar BP: 118/68 mmHg Pulse Rate: 102 COWS:    PATIENT STRENGTHS: (choose at least two) Average or above average intelligence Supportive family/friends  Allergies:  Allergies  Allergen Reactions  . Tradjenta [Linagliptin] Other (See Comments)    Hx of pancreatitis    Home Medications:  Medications Prior to Admission  Medication Sig Dispense Refill  . aspirin EC 81 MG tablet Take 81 mg by mouth daily.       Marland Kitchen. buPROPion (WELLBUTRIN XL) 150 MG 24 hr tablet Take 300 mg by mouth every evening.      . carbamazepine (TEGRETOL) 200 MG tablet Take 200 mg by mouth 2 (two) times daily.      . cetirizine (ZYRTEC) 10 MG tablet Take 10 mg by mouth daily.      . diphenhydrAMINE (BENADRYL) 25 MG tablet Take 12.5 mg by mouth at bedtime as needed. For sleep      . fenofibrate 160 MG tablet Take 160 mg by mouth daily.      . insulin aspart protamine- aspart (NOVOLOG MIX 70/30) (70-30) 100 UNIT/ML injection Inject 25 Units into the skin 2 (two) times daily with a meal.      . lisinopril (PRINIVIL,ZESTRIL) 20 MG tablet Take 20 mg by mouth daily.      Marland Kitchen. LORazepam (ATIVAN) 0.5 MG tablet Take 0.5-1 mg by mouth daily  as needed for anxiety.      Marland Kitchen. omeprazole (PRILOSEC OTC) 20 MG tablet Take 20 mg by mouth daily.      Marland Kitchen. PARoxetine (PAXIL) 40 MG tablet Take 60 mg by mouth at bedtime.      . simvastatin (ZOCOR) 20 MG tablet Take 1 tablet (20 mg total) by mouth daily.  30 tablet  6      Disposition:  -Seroquel 25mg  tid (script for 30 days) -Discharge home with outpatient resources for psychiatry/counseling. Elijah Birk(Tom [facility] to assist)   Beau FannyWithrow, Christophe Rising C, FNP-BC 03/24/2014 12:01 PM

## 2014-03-24 NOTE — Plan of Care (Signed)
[facility] Observation Crisis Plan  Reason for Crisis Plan:  Medication Management   Plan of Care:  Referral for IOP  Family Support:    Husband  Current Living Environment:  Living Arrangements: Spouse/significant other; husband; pt can return to household.  Insurance:  Primary: Medicare; Secondary: [facility]   Name Acct ID Class Status Primary Coverage   [name], [name] [id number] BEHAVIORAL HEALTH OBSERVATION Open MEDICARE - MEDICARE PART A AND B        Guarantor Account (for Hospital Account [id number])   Name Relation to Pt Service Area Active? Acct Type   [name], [name] Self [facility] Yes [facility]   Address Phone       [address] [place], [place] [address] [phone])          Coverage Information (for Hospital Account [id number])   1. MEDICARE/MEDICARE PART A AND B   F/O Payor/Plan Precert #   MEDICARE/MEDICARE PART A AND B    Subscriber Subscriber #   [name], [name] [id number]   Address Phone   [address] [place] [id number]        2. BLUE CROSS BLUE SHIELD/BCBS STATE HEALTH PPO   F/O Payor/Plan Precert #   BLUE CROSS [facility]ELD/BCBS STATE HEALTH PPO    Subscriber Subscriber #   [name], [name] [id number]   Address Phone   [address] [address] [place], [place] [address] [phone]          Legal Guardian:   Self  Primary Care Provider:  [name],Wendy D, MD  Current Outpatient Providers:  [name], MD at [facility]  Psychiatrist:    [name], MD at [facility]  Counselor/Therapist:   None  Compliant with Medications:  Yes  Additional Information: After consulting with Wendy Headonrad Withrow, NP it has been determined that pt does not present a life threatening danger to herself or others, and that psychiatric hospitalization is not indicated for her at this time.  It has been determined that pt would benefit, however, from enrollment in Mental Health  Intensive Outpatient Programming, to which she is agreeable.  She [facility] on Wednesday, 03/26/2014 at 08:45.  This information will be included in pt's discharge instructions.  She has been provided with an Field seismologistinformational flyer for the program, as well as intake paperwork.  She has been advised to fill out the paperwork at her convenience and to take it with her on the first day of the program.  Wendy Canninghomas Marli Diego, MA Triage Specialist [name], Wendy Bouillonhomas Patrick 10/13/20153:59 PM

## 2014-03-24 NOTE — Progress Notes (Signed)
Patient ID: [name], female   DOB: 06-Aug-1955, 58 y.o.   MRN: [id number] 03-24-14 nursing shift note: D: pt was bright on approach. She denied any si/hi. A: when asked why she is here she stated " she stated she thinks her neighbors are spying on her". When ask why she thought that, she stated " I don't know". She had no complaints of pain and voiced no other concerns. She took all of her medications. R: RN will monitor and Q 15 min ck's continue.

## 2014-03-24 NOTE — Discharge Instructions (Signed)
For your current behavioral health needs, you have been scheduled to start the Mental Health Intensive Outpatient Program (MH-IOP) at [facility] on Wednesday, 03/26/2014.  The program meets from 9:00 am - 12:00 pm, Monday - Friday.  Plan to arrive at 8:45 am for the first session.  You have been given a packet in intake paperwork for the program.  Fill it out at your convenience and take it with you for the first session.       [facility]      [address]      [place], [place] [address]      Contact person: Jeri ModenaRita Clark, MEd      [phone]  If you have any questions, please call Bayshore SinkRita at the number indicated above.

## 2014-03-29 NOTE — H&P (Signed)
Case discussed,agree with treatment plan 

## 2014-03-30 NOTE — Consult Note (Signed)
Case discussed, agree with plan 

## 2014-03-30 NOTE — Discharge Summary (Signed)
Case discussed, agree with plan 

## 2014-04-06 DIAGNOSIS — F22 Delusional disorders: Secondary | ICD-10-CM | POA: Diagnosis not present

## 2014-04-09 DIAGNOSIS — F22 Delusional disorders: Secondary | ICD-10-CM | POA: Diagnosis not present

## 2014-04-13 DIAGNOSIS — F22 Delusional disorders: Secondary | ICD-10-CM | POA: Diagnosis not present

## 2014-04-20 DIAGNOSIS — F22 Delusional disorders: Secondary | ICD-10-CM | POA: Diagnosis not present

## 2014-05-18 DIAGNOSIS — F22 Delusional disorders: Secondary | ICD-10-CM | POA: Diagnosis not present

## 2014-05-19 DIAGNOSIS — F22 Delusional disorders: Secondary | ICD-10-CM | POA: Diagnosis not present

## 2014-05-20 DIAGNOSIS — E781 Pure hyperglyceridemia: Secondary | ICD-10-CM | POA: Diagnosis not present

## 2014-05-20 DIAGNOSIS — F329 Major depressive disorder, single episode, unspecified: Secondary | ICD-10-CM | POA: Diagnosis not present

## 2014-05-20 DIAGNOSIS — I10 Essential (primary) hypertension: Secondary | ICD-10-CM | POA: Diagnosis not present

## 2014-05-20 DIAGNOSIS — E782 Mixed hyperlipidemia: Secondary | ICD-10-CM | POA: Diagnosis not present

## 2014-05-20 DIAGNOSIS — E669 Obesity, unspecified: Secondary | ICD-10-CM | POA: Diagnosis not present

## 2014-05-20 DIAGNOSIS — E1140 Type 2 diabetes mellitus with diabetic neuropathy, unspecified: Secondary | ICD-10-CM | POA: Diagnosis not present

## 2014-05-20 DIAGNOSIS — G609 Hereditary and idiopathic neuropathy, unspecified: Secondary | ICD-10-CM | POA: Diagnosis not present

## 2014-05-20 DIAGNOSIS — E1122 Type 2 diabetes mellitus with diabetic chronic kidney disease: Secondary | ICD-10-CM | POA: Diagnosis not present

## 2014-06-19 DIAGNOSIS — F22 Delusional disorders: Secondary | ICD-10-CM | POA: Diagnosis not present

## 2014-06-30 DIAGNOSIS — F22 Delusional disorders: Secondary | ICD-10-CM | POA: Diagnosis not present

## 2014-08-04 DIAGNOSIS — F22 Delusional disorders: Secondary | ICD-10-CM | POA: Diagnosis not present

## 2014-08-06 DIAGNOSIS — F22 Delusional disorders: Secondary | ICD-10-CM | POA: Diagnosis not present

## 2014-08-11 DIAGNOSIS — M5412 Radiculopathy, cervical region: Secondary | ICD-10-CM | POA: Diagnosis not present

## 2014-08-18 DIAGNOSIS — E109 Type 1 diabetes mellitus without complications: Secondary | ICD-10-CM | POA: Diagnosis not present

## 2014-08-18 DIAGNOSIS — H10413 Chronic giant papillary conjunctivitis, bilateral: Secondary | ICD-10-CM | POA: Diagnosis not present

## 2014-08-25 DIAGNOSIS — M542 Cervicalgia: Secondary | ICD-10-CM | POA: Diagnosis not present

## 2014-08-25 DIAGNOSIS — M79602 Pain in left arm: Secondary | ICD-10-CM | POA: Diagnosis not present

## 2014-09-02 ENCOUNTER — Other Ambulatory Visit: Payer: Self-pay | Admitting: Internal Medicine

## 2014-09-02 DIAGNOSIS — M542 Cervicalgia: Secondary | ICD-10-CM

## 2014-09-07 ENCOUNTER — Ambulatory Visit
Admission: RE | Admit: 2014-09-07 | Discharge: 2014-09-07 | Disposition: A | Discharge status: Home / Self Care | Payer: Medicare Other | Source: Ambulatory Visit | Attending: Internal Medicine | Admitting: Internal Medicine

## 2014-09-07 DIAGNOSIS — E119 Type 2 diabetes mellitus without complications: Secondary | ICD-10-CM | POA: Diagnosis not present

## 2014-09-07 DIAGNOSIS — S199XXA Unspecified injury of neck, initial encounter: Secondary | ICD-10-CM | POA: Diagnosis not present

## 2014-09-07 DIAGNOSIS — I10 Essential (primary) hypertension: Secondary | ICD-10-CM | POA: Diagnosis not present

## 2014-09-07 DIAGNOSIS — M5022 Other cervical disc displacement, mid-cervical region: Secondary | ICD-10-CM | POA: Diagnosis not present

## 2014-09-07 DIAGNOSIS — M542 Cervicalgia: Secondary | ICD-10-CM

## 2014-09-17 DIAGNOSIS — M542 Cervicalgia: Secondary | ICD-10-CM | POA: Diagnosis not present

## 2014-09-17 DIAGNOSIS — M5012 Cervical disc disorder with radiculopathy, mid-cervical region: Secondary | ICD-10-CM | POA: Diagnosis not present

## 2014-09-23 ENCOUNTER — Other Ambulatory Visit: Payer: Self-pay | Admitting: Internal Medicine

## 2014-09-23 DIAGNOSIS — E782 Mixed hyperlipidemia: Secondary | ICD-10-CM | POA: Diagnosis not present

## 2014-09-23 DIAGNOSIS — R1310 Dysphagia, unspecified: Secondary | ICD-10-CM | POA: Diagnosis not present

## 2014-09-23 DIAGNOSIS — E1140 Type 2 diabetes mellitus with diabetic neuropathy, unspecified: Secondary | ICD-10-CM | POA: Diagnosis not present

## 2014-09-23 DIAGNOSIS — F3130 Bipolar disorder, current episode depressed, mild or moderate severity, unspecified: Secondary | ICD-10-CM | POA: Diagnosis not present

## 2014-09-23 DIAGNOSIS — M542 Cervicalgia: Secondary | ICD-10-CM | POA: Diagnosis not present

## 2014-09-23 DIAGNOSIS — I10 Essential (primary) hypertension: Secondary | ICD-10-CM | POA: Diagnosis not present

## 2014-09-23 DIAGNOSIS — E669 Obesity, unspecified: Secondary | ICD-10-CM | POA: Diagnosis not present

## 2014-09-23 DIAGNOSIS — Z6841 Body Mass Index (BMI) 40.0 and over, adult: Secondary | ICD-10-CM | POA: Diagnosis not present

## 2014-09-25 ENCOUNTER — Ambulatory Visit
Admission: RE | Admit: 2014-09-25 | Discharge: 2014-09-25 | Disposition: A | Discharge status: Home / Self Care | Payer: Medicare Other | Source: Ambulatory Visit | Attending: Internal Medicine | Admitting: Internal Medicine

## 2014-09-25 DIAGNOSIS — K222 Esophageal obstruction: Secondary | ICD-10-CM | POA: Diagnosis not present

## 2014-09-25 DIAGNOSIS — K449 Diaphragmatic hernia without obstruction or gangrene: Secondary | ICD-10-CM | POA: Diagnosis not present

## 2014-09-25 DIAGNOSIS — R1310 Dysphagia, unspecified: Secondary | ICD-10-CM

## 2014-10-12 ENCOUNTER — Other Ambulatory Visit (HOSPITAL_COMMUNITY): Payer: Self-pay | Admitting: *Deleted

## 2014-10-12 DIAGNOSIS — Z1211 Encounter for screening for malignant neoplasm of colon: Secondary | ICD-10-CM | POA: Diagnosis not present

## 2014-10-12 DIAGNOSIS — R1310 Dysphagia, unspecified: Secondary | ICD-10-CM | POA: Diagnosis not present

## 2014-10-12 DIAGNOSIS — M5012 Cervical disc disorder with radiculopathy, mid-cervical region: Secondary | ICD-10-CM | POA: Diagnosis not present

## 2014-10-12 DIAGNOSIS — K222 Esophageal obstruction: Secondary | ICD-10-CM | POA: Diagnosis not present

## 2014-10-12 NOTE — Pre-Procedure Instructions (Signed)
[name]  10/12/2014   Your procedure is scheduled on:  Thursday, Oct 15, 2014 at 7:30 AM.   Report to [facility] Entrance "A" Admitting Office at 5:30 AM.   Call this number if you have problems the morning of surgery: [phone]    Remember:   Do not eat food or drink liquids after midnight Wednesday, 10/14/14.   Take these medicines the morning of surgery with A SIP OF WATER: Bupropion (Wellbutrin), Carbamazepine (Tegretol), Cetirizine ( Zyrtec), Omeprazole (Prilosec), Tylenol - if needed              Do NOT take your diabetic medications the morning of surgery.   Do not wear jewelry, make-up or nail polish.  Do not wear lotions, powders, or perfumes. You may wear deodorant.  Do not shave 48 hours prior to surgery. Men may shave face and neck.  Do not bring valuables to the hospital.  [facility] is not responsible                  for any belongings or valuables.               Contacts, dentures or bridgework may not be worn into surgery.  Leave suitcase in the car. After surgery it may be brought to your room.  For patients admitted to the hospital, discharge time is determined by your                treatment team.               Special Instructions: [facility] - Preparing for Surgery  Before surgery, you can play an important role.  Because skin is not sterile, your skin needs to be as free of germs as possible.  You can reduce the number of germs on you skin by washing with CHG (chlorahexidine gluconate) soap before surgery.  CHG is an antiseptic cleaner which kills germs and bonds with the skin to continue killing germs even after washing.  Please DO NOT use if you have an allergy to CHG or antibacterial soaps.  If your skin becomes reddened/irritated stop using the CHG and inform your nurse when you arrive at Short Stay.  Do not shave (including legs and underarms) for at least 48 hours prior to the first CHG shower.  You may shave your face.  Please follow  these instructions carefully:   1.  Shower with CHG Soap the night before surgery and the                                morning of Surgery.  2.  If you choose to wash your hair, wash your hair first as usual with your       normal shampoo.  3.  After you shampoo, rinse your hair and body thoroughly to remove the                      Shampoo.  4.  Use CHG as you would any other liquid soap.  You can apply chg directly       to the skin and wash gently with scrungie or a clean washcloth.  5.  Apply the CHG Soap to your body ONLY FROM THE NECK DOWN.        Do not use on open wounds or open sores.  Avoid contact with your eyes, ears, mouth and  genitals (private parts).  Wash genitals (private parts) with your normal soap.  6.  Wash thoroughly, paying special attention to the area where your surgery        will be performed.  7.  Thoroughly rinse your body with warm water from the neck down.  8.  DO NOT shower/wash with your normal soap after using and rinsing off       the CHG Soap.  9.  Pat yourself dry with a clean towel.            10.  Wear clean pajamas.            11.  Place clean sheets on your bed the night of your first shower and do not        sleep with pets.  Day of Surgery  Do not apply any lotions the morning of surgery.  Please wear clean clothes to the hospital.     Please read over the following fact sheets that you were given: Pain Booklet, Coughing and Deep Breathing, MRSA Information and Surgical Site Infection Prevention

## 2014-10-12 NOTE — Addendum Note (Signed)
Addended by: Winifred OliveRABTREE, Demond Shallenberger W on: 10/12/2014 06:46 PM   Modules accepted: Orders

## 2014-10-13 ENCOUNTER — Encounter (HOSPITAL_COMMUNITY): Payer: Self-pay | Admitting: Emergency Medicine

## 2014-10-13 ENCOUNTER — Encounter (HOSPITAL_COMMUNITY)
Admission: RE | Admit: 2014-10-13 | Discharge: 2014-10-13 | Disposition: A | Discharge status: Home / Self Care | Payer: Medicare Other | Source: Ambulatory Visit | Attending: Orthopedic Surgery | Admitting: Orthopedic Surgery

## 2014-10-13 ENCOUNTER — Encounter (HOSPITAL_COMMUNITY): Payer: Self-pay

## 2014-10-13 DIAGNOSIS — Z01812 Encounter for preprocedural laboratory examination: Secondary | ICD-10-CM | POA: Diagnosis not present

## 2014-10-13 HISTORY — DX: Anemia, unspecified: D64.9

## 2014-10-13 HISTORY — DX: Acute pancreatitis without necrosis or infection, unspecified: K85.90

## 2014-10-13 HISTORY — DX: Unspecified osteoarthritis, unspecified site: M19.90

## 2014-10-13 HISTORY — DX: Anxiety disorder, unspecified: F41.9

## 2014-10-13 LAB — BASIC METABOLIC PANEL
ANION GAP: 9 (ref 5–15)
BUN: 15 mg/dL (ref 6–20)
CO2: 25 mmol/L (ref 22–32)
CREATININE: 0.8 mg/dL (ref 0.44–1.00)
Calcium: 9.1 mg/dL (ref 8.9–10.3)
Chloride: 102 mmol/L (ref 101–111)
Glucose, Bld: 353 mg/dL — ABNORMAL HIGH (ref 70–99)
Potassium: 4.2 mmol/L (ref 3.5–5.1)
SODIUM: 136 mmol/L (ref 135–145)

## 2014-10-13 LAB — CBC
HCT: 35.3 % — ABNORMAL LOW (ref 36.0–46.0)
Hemoglobin: 11.5 g/dL — ABNORMAL LOW (ref 12.0–15.0)
MCH: 28.5 pg (ref 26.0–34.0)
MCHC: 32.6 g/dL (ref 30.0–36.0)
MCV: 87.4 fL (ref 78.0–100.0)
PLATELETS: 257 10*3/uL (ref 150–400)
RBC: 4.04 MIL/uL (ref 3.87–5.11)
RDW: 13.9 % (ref 11.5–15.5)
WBC: 7.7 10*3/uL (ref 4.0–10.5)

## 2014-10-13 LAB — SURGICAL PCR SCREEN
MRSA, PCR: NEGATIVE
Staphylococcus aureus: POSITIVE — AB

## 2014-10-13 NOTE — Progress Notes (Signed)
   10/13/14 0834  OBSTRUCTIVE SLEEP APNEA  Have you ever been diagnosed with sleep apnea through a sleep study? No  Do you snore loudly (loud enough to be heard through closed doors)?  1  Do you often feel tired, fatigued, or sleepy during the daytime? 1  Has anyone observed you stop breathing during your sleep? 0  Do you have, or are you being treated for high blood pressure? 1  BMI more than 35 kg/m2? 0  Age over 60 years old? 1  Neck circumference greater than 40 cm/16 inches? 1  Gender: 0

## 2014-10-13 NOTE — Progress Notes (Signed)
Anesthesia Chart Review:  Pt is 59 year old female scheduled for C6-7 ACDF on 10/15/2014 with Dr. Shon BatonBrooks.   PMH includes: HTN, DM, high cholesterol, anemia, pancreatitis, GERD. Never smoker. BMI 34.   Medications include: ASA, buproprion, carbamazepine, insulin, lisinopril, paroxetine, quetiapine, simvastatin, trazodone.   Preoperative labs reviewed.  Glucose 353. Pt does not check her blood sugar at home. Pt reports last HgbA1c was 12.5 a couple of months ago.   EKG 03/24/2014: NSR.   Discussed pt case with Dr. Jean RosenthalJackson. Pt needs medical clearance. Should meet with PCP prior to surgery to work on achieving tighter glycemic control. [facility] in Dr. Shon BatonBrooks' office.   Rica Mastngela Rynell Ciotti, FNP-BC [facility] Phone: [phone] 10/14/2014 1:01 PM

## 2014-10-13 NOTE — Progress Notes (Addendum)
Pt states she never checks her blood sugar at home even though she does have a CBG meter. She states her last Hgb A1C was 12.5 "a couple of months ago".   Called Dr. Idelle CrouchPolite's office (pt's PCP) to get most recent Hbg A1c. Also requested last OV notes.  Pt states she has a history of suicide attempts, but states it has been many years ago. No recent attempt. [facility]. Today, when I asked her if she felt safe where she lives, she states she is having trouble with her upstairs female neighbors looking into her condo. She states that she is talking with police about this. She states she does not feel unsafe. Her husband lives with her.

## 2014-10-13 NOTE — Progress Notes (Signed)
Mupirocin Ointment Rx called into [facility] on [address] for positive PCR. Left message on pt's voicemail notifying her of positive staph.

## 2014-10-14 NOTE — Progress Notes (Signed)
Medical clearance requested from Dr.Ronald Polite's office

## 2014-10-15 ENCOUNTER — Inpatient Hospital Stay (HOSPITAL_COMMUNITY): Admission: RE | Admit: 2014-10-15 | Payer: Medicare Other | Source: Ambulatory Visit | Admitting: Orthopedic Surgery

## 2014-10-15 ENCOUNTER — Encounter (HOSPITAL_COMMUNITY): Admission: RE | Payer: Self-pay | Source: Ambulatory Visit

## 2014-10-15 SURGERY — ESOPHAGOGASTRODUODENOSCOPY (EGD) WITH PROPOFOL
Anesthesia: Monitor Anesthesia Care

## 2014-10-15 SURGERY — ANTERIOR CERVICAL DECOMPRESSION/DISCECTOMY FUSION 1 LEVEL
Anesthesia: General | Laterality: Left

## 2014-10-22 DIAGNOSIS — E1122 Type 2 diabetes mellitus with diabetic chronic kidney disease: Secondary | ICD-10-CM | POA: Diagnosis not present

## 2014-10-22 DIAGNOSIS — E1140 Type 2 diabetes mellitus with diabetic neuropathy, unspecified: Secondary | ICD-10-CM | POA: Diagnosis not present

## 2014-10-29 DIAGNOSIS — F22 Delusional disorders: Secondary | ICD-10-CM | POA: Diagnosis not present

## 2014-11-12 DIAGNOSIS — E1122 Type 2 diabetes mellitus with diabetic chronic kidney disease: Secondary | ICD-10-CM | POA: Diagnosis not present

## 2014-11-25 DIAGNOSIS — E1122 Type 2 diabetes mellitus with diabetic chronic kidney disease: Secondary | ICD-10-CM | POA: Diagnosis not present

## 2014-11-25 DIAGNOSIS — Z794 Long term (current) use of insulin: Secondary | ICD-10-CM | POA: Diagnosis not present

## 2014-11-25 DIAGNOSIS — E1140 Type 2 diabetes mellitus with diabetic neuropathy, unspecified: Secondary | ICD-10-CM | POA: Diagnosis not present

## 2014-11-26 DIAGNOSIS — R1310 Dysphagia, unspecified: Secondary | ICD-10-CM | POA: Diagnosis not present

## 2014-11-26 DIAGNOSIS — Z1211 Encounter for screening for malignant neoplasm of colon: Secondary | ICD-10-CM | POA: Diagnosis not present

## 2014-12-17 ENCOUNTER — Other Ambulatory Visit: Payer: Self-pay | Admitting: Gastroenterology

## 2014-12-17 ENCOUNTER — Encounter (HOSPITAL_COMMUNITY): Payer: Self-pay | Admitting: *Deleted

## 2014-12-17 NOTE — Addendum Note (Signed)
Addended by: Ly Wass on: 12/17/2014 01:29 PM   Modules accepted: Orders  

## 2014-12-23 ENCOUNTER — Ambulatory Visit (HOSPITAL_COMMUNITY): Payer: Medicare Other | Admitting: Anesthesiology

## 2014-12-23 ENCOUNTER — Ambulatory Visit (HOSPITAL_COMMUNITY)
Admission: RE | Admit: 2014-12-23 | Discharge: 2014-12-23 | Disposition: A | Discharge status: Home / Self Care | Payer: Medicare Other | Source: Ambulatory Visit | Attending: Gastroenterology | Admitting: Gastroenterology

## 2014-12-23 ENCOUNTER — Encounter (HOSPITAL_COMMUNITY)
Admission: RE | Disposition: A | Discharge status: Home / Self Care | Payer: Self-pay | Source: Ambulatory Visit | Attending: Gastroenterology

## 2014-12-23 ENCOUNTER — Encounter (HOSPITAL_COMMUNITY): Payer: Self-pay | Admitting: Anesthesiology

## 2014-12-23 DIAGNOSIS — R1310 Dysphagia, unspecified: Secondary | ICD-10-CM | POA: Diagnosis not present

## 2014-12-23 DIAGNOSIS — R933 Abnormal findings on diagnostic imaging of other parts of digestive tract: Secondary | ICD-10-CM | POA: Diagnosis not present

## 2014-12-23 DIAGNOSIS — I10 Essential (primary) hypertension: Secondary | ICD-10-CM | POA: Diagnosis not present

## 2014-12-23 DIAGNOSIS — Z794 Long term (current) use of insulin: Secondary | ICD-10-CM | POA: Insufficient documentation

## 2014-12-23 DIAGNOSIS — Z1211 Encounter for screening for malignant neoplasm of colon: Secondary | ICD-10-CM | POA: Diagnosis not present

## 2014-12-23 DIAGNOSIS — K222 Esophageal obstruction: Secondary | ICD-10-CM | POA: Diagnosis not present

## 2014-12-23 DIAGNOSIS — E119 Type 2 diabetes mellitus without complications: Secondary | ICD-10-CM | POA: Diagnosis not present

## 2014-12-23 DIAGNOSIS — K5790 Diverticulosis of intestine, part unspecified, without perforation or abscess without bleeding: Secondary | ICD-10-CM | POA: Diagnosis not present

## 2014-12-23 HISTORY — PX: BALLOON DILATION: SHX5330

## 2014-12-23 HISTORY — PX: COLONOSCOPY WITH PROPOFOL: SHX5780

## 2014-12-23 HISTORY — PX: ESOPHAGOGASTRODUODENOSCOPY (EGD) WITH PROPOFOL: SHX5813

## 2014-12-23 SURGERY — COLONOSCOPY WITH PROPOFOL
Anesthesia: Monitor Anesthesia Care

## 2014-12-23 MED ORDER — BUTAMBEN-TETRACAINE-BENZOCAINE 2-2-14 % EX AERO
INHALATION_SPRAY | CUTANEOUS | Status: DC | PRN
  Administered 2014-12-23: 1 via TOPICAL

## 2014-12-23 MED ORDER — KETAMINE HCL 10 MG/ML IJ SOLN
INTRAMUSCULAR | Status: AC
Start: 2014-12-23 — End: 2014-12-23
  Filled 2014-12-23: qty 1

## 2014-12-23 MED ORDER — LIDOCAINE HCL (CARDIAC) 20 MG/ML IV SOLN
INTRAVENOUS | Status: AC
  Filled 2014-12-23: qty 5

## 2014-12-23 MED ORDER — LACTATED RINGERS IV SOLN
INTRAVENOUS | Status: DC
  Administered 2014-12-23: 09:00:00 via INTRAVENOUS

## 2014-12-23 MED ORDER — MIDAZOLAM HCL 5 MG/5ML IJ SOLN
INTRAMUSCULAR | Status: DC | PRN
  Administered 2014-12-23 (×2): 1 mg via INTRAVENOUS

## 2014-12-23 MED ORDER — SODIUM CHLORIDE 0.9 % IV SOLN: INTRAVENOUS | Status: DC

## 2014-12-23 MED ORDER — LACTATED RINGERS IV SOLN
INTRAVENOUS | Status: DC | PRN
  Administered 2014-12-23: 09:00:00 via INTRAVENOUS

## 2014-12-23 MED ORDER — PROPOFOL 10 MG/ML IV BOLUS
INTRAVENOUS | Status: AC
  Filled 2014-12-23: qty 20

## 2014-12-23 MED ORDER — MIDAZOLAM HCL 2 MG/2ML IJ SOLN
INTRAMUSCULAR | Status: AC
  Filled 2014-12-23: qty 2

## 2014-12-23 MED ORDER — FENTANYL CITRATE (PF) 100 MCG/2ML IJ SOLN: 25.0000 ug | INTRAMUSCULAR | Status: DC | PRN

## 2014-12-23 MED ORDER — PROPOFOL 10 MG/ML IV BOLUS
INTRAVENOUS | Status: DC | PRN
  Administered 2014-12-23: 20 mg via INTRAVENOUS
  Administered 2014-12-23: 10 mg via INTRAVENOUS
  Administered 2014-12-23 (×4): 20 mg via INTRAVENOUS
  Administered 2014-12-23 (×3): 10 mg via INTRAVENOUS
  Administered 2014-12-23 (×7): 20 mg via INTRAVENOUS

## 2014-12-23 SURGICAL SUPPLY — 24 items

## 2014-12-23 NOTE — Op Note (Signed)
[facility] [address] [place], [id number]   ENDOSCOPY PROCEDURE REPORT  PATIENT: [name], [name]  MR#: [id number] BIRTHDATE: 10/21/1955 , 59  yrs. old GENDER: female ENDOSCOPIST: Willis ModenaWilliam Twilla Khouri, MD REFERRED BY:  Renford Dillsonald Polite, M.D. PROCEDURE DATE:  12/23/2014 PROCEDURE:  Esophagoscopy ASA CLASS:     Class III INDICATIONS:  dysphagia, abnormal esophagram. MEDICATIONS: Monitored anesthesia care TOPICAL ANESTHETIC:  DESCRIPTION OF PROCEDURE: After the risks benefits and alternatives of the procedure were thoroughly explained, informed consent was obtained.  The Pentax Gastroscope [id number] endoscope was introduced through the mouth and advanced to the gastroesophageal junction. The instrument was slowly withdrawn as the mucosa was fully examined. Estimated blood loss is zero unless otherwise noted in this procedure report.    Findings:  No obvious proximal esophageal web was identified. Stricture/ring in distal esophagus was noted about 1 cm proximal to the GE junction.  Stricture was smooth-appearing, no ulceration, no worrisome feature of mass.  No esophageal features to suggest eosinophilic esophagitis.  Stricture diameter about 8mm; unable to pass diagnostic endoscope through the stricture.  Stricture serially dilated in 1mm increments from 8mm to 12mm, with bloody show seen at 11mm and 12mm.               The scope was then withdrawn from the patient and the procedure completed.  COMPLICATIONS: There were no immediate complications.  ENDOSCOPIC IMPRESSION:     As above  Fairly tight distal esophageal stricture, dilated from 8mm to 12mm.  RECOMMENDATIONS:     1.  Watch for potential complications of procedure. 2.  Resume Prilosec-OTC 20 mg po qd. 3.  Repeat endoscopy with further esophageal dilatation in 2 weeks. 4.  Proceed with screening colonoscopy today.  eSigned:  Willis ModenaWilliam Shamon Cothran, MD 12/23/2014 10:40 AM   CC:  CPT CODES: ICD  CODES:  The ICD and CPT codes recommended by this software are interpretations from the data that the clinical staff has captured with the software.  The verification of the translation of this report to the ICD and CPT codes and modifiers is the sole responsibility of the health care institution and practicing physician where this report was generated.  [employer] will not be held responsible for the validity of the ICD and CPT codes included on this report.  AMA assumes no liability for data contained or not contained herein. CPT is a Publishing rights managerregistered trademark of the [employer].

## 2014-12-23 NOTE — Discharge Instructions (Signed)
Colonoscopy, Care After °These instructions give you information on caring for yourself after your procedure. Your doctor may also give you more specific instructions. Call your doctor if you have any problems or questions after your procedure. °HOME CARE °· Do not drive for 24 hours. °· Do not sign important papers or use machinery for 24 hours. °· You may shower. °· You may go back to your usual activities, but go slower for the first 24 hours. °· Take rest breaks often during the first 24 hours. °· Walk around or use warm packs on your belly (abdomen) if you have belly cramping or gas. °· Drink enough fluids to keep your pee (urine) clear or pale yellow. °· Resume your normal diet. Avoid heavy or fried foods. °· Avoid drinking alcohol for 24 hours or as told by your doctor. °· Only take medicines as told by your doctor. °If a tissue sample (biopsy) was taken during the procedure:  °· Do not take aspirin or blood thinners for 7 days, or as told by your doctor. °· Do not drink alcohol for 7 days, or as told by your doctor. °· Eat soft foods for the first 24 hours. °GET HELP IF: °You still have a small amount of blood in your poop (stool) 2-3 days after the procedure. °GET HELP RIGHT AWAY IF: °· You have more than a small amount of blood in your poop. °· You see clumps of tissue (blood clots) in your poop. °· Your belly is puffy (swollen). °· You feel sick to your stomach (nauseous) or throw up (vomit). °· You have a fever. °· You have belly pain that gets worse and medicine does not help. °MAKE SURE YOU: °· Understand these instructions. °· Will watch your condition. °· Will get help right away if you are not doing well or get worse. °Document Released: 07/01/2010 Document Revised: 06/03/2013 Document Reviewed: 02/03/2013 °ExitCare® Patient Information ©2015 [employer]. This information is not intended to replace advice given to you by your health care provider. Make sure you discuss any questions you have with  your health care provider. °Esophagogastroduodenoscopy °Care After °Refer to this sheet in the next few weeks. These instructions provide you with information on caring for yourself after your procedure. Your caregiver may also give you more specific instructions. Your treatment has been planned according to current medical practices, but problems sometimes occur. Call your caregiver if you have any problems or questions after your procedure.  °HOME CARE INSTRUCTIONS °· Do not eat or drink anything until the numbing medicine (local anesthetic) has worn off and your gag reflex has returned. You will know that the local anesthetic has worn off when you can swallow comfortably. °· Do not drive for 12 hours after the procedure or as directed by your caregiver. °· Only take medicines as directed by your caregiver. °SEEK MEDICAL CARE IF:  °· You cannot stop coughing. °· You are not urinating at all or less than usual. °SEEK IMMEDIATE MEDICAL CARE IF: °· You have difficulty swallowing. °· You cannot eat or drink. °· You have worsening throat or chest pain. °· You have dizziness, lightheadedness, or you faint. °· You have nausea or vomiting. °· You have chills. °· You have a fever. °· You have severe abdominal pain. °· You have black, tarry, or bloody stools. °Document Released: 05/15/2012 Document Reviewed: 05/15/2012 °ExitCare® Patient Information ©2015 [employer]. This information is not intended to replace advice given to you by your health care provider. Make sure you   discuss any questions you have with your health care provider. ° °

## 2014-12-23 NOTE — Transfer of Care (Signed)
Immediate Anesthesia Transfer of Care Note  Patient: [name]  Procedure(s) Performed: Procedure(s): COLONOSCOPY WITH PROPOFOL (N/A) ESOPHAGOGASTRODUODENOSCOPY (EGD) WITH PROPOFOL (N/A) BALLOON DILATION (N/A)  Patient Location: PACU and Endoscopy Unit  Anesthesia Type:MAC  Level of Consciousness: awake, alert , oriented and patient cooperative  Airway & Oxygen Therapy: Patient Spontanous Breathing, Patient connected to nasal cannula oxygen and Patient connected to face mask oxygen  Post-op Assessment: Report given to RN and Post -op Vital signs reviewed and stable  Post vital signs: Reviewed and stable  Last Vitals:  Filed Vitals:   12/23/14 1039  BP: 128/66  Pulse: 104  Temp:   Resp: 14    Complications: No apparent anesthesia complications

## 2014-12-23 NOTE — Anesthesia Postprocedure Evaluation (Signed)
  Anesthesia Post-op Note  Patient: Wendy MercuryCynthia K Tiger  Procedure(s) Performed: Procedure(s) (LRB): COLONOSCOPY WITH PROPOFOL (N/A) ESOPHAGOGASTRODUODENOSCOPY (EGD) WITH PROPOFOL (N/A) BALLOON DILATION (N/A)  Patient Location: PACU  Anesthesia Type: MAC  Level of Consciousness: awake and alert   Airway and Oxygen Therapy: Patient Spontanous Breathing  Post-op Pain: mild  Post-op Assessment: Post-op Vital signs reviewed, Patient's Cardiovascular Status Stable, Respiratory Function Stable, Patent Airway and No signs of Nausea or vomiting  Last Vitals:  Filed Vitals:   12/23/14 1046  BP:   Pulse: 95  Temp:   Resp: 15    Post-op Vital Signs: stable   Complications: No apparent anesthesia complications

## 2014-12-23 NOTE — H&P (Signed)
Patient interval history reviewed.  Patient examined again.  There has been no change from documented H/P dated 11/26/14 (scanned into chart from our office) except as documented above.  Assessment:  1.  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 2.  Average risk colon cancer screening.  Last exam 10 years ago normal.  Plan:  1.  Endoscopy with possible esophageal dilatation. 2.  Risks (bleeding, infection, bowel perforation that could require surgery, sedation-related changes in cardiopulmonary systems), benefits (identification and possible treatment of source of symptoms, exclusion of certain causes of symptoms), and alternatives (watchful waiting, radiographic imaging studies, empiric medical treatment) of upper endoscopy with possible esophageal dilatation (EGD +/- DIL) were explained to patient/family in detail and patient wishes to proceed. 3.  Colonoscopy. 4.  Risks (bleeding, infection, bowel perforation that could require surgery, sedation-related changes in cardiopulmonary systems), benefits (identification and possible treatment of source of symptoms, exclusion of certain causes of symptoms), and alternatives (watchful waiting, radiographic imaging studies, empiric medical treatment) of colonoscopy were explained to patient/family in detail and patient wishes to proceed.

## 2014-12-23 NOTE — Op Note (Signed)
[facility] [address]  [place], [id number]   COLONOSCOPY PROCEDURE REPORT  PATIENT: [name], [name]  MR#: [id number] BIRTHDATE: [birth year] , 59  yrs. old GENDER: female ENDOSCOPIST: Willis ModenaWilliam Rubina Basinski, MD REFERRED HQ:IONGEXBY:Ronald Polite, M.D. PROCEDURE DATE:  12/23/2014 PROCEDURE:   Colonoscopy, screening ASA CLASS:   Class III INDICATIONS:average risk patient for colon cancer, last colonoscopy 10 years ago MEDICATIONS: Monitored anesthesia care  DESCRIPTION OF PROCEDURE:   After the risks benefits and alternatives of the procedure were thoroughly explained, informed consent was obtained.  revealed no abnormalities of the rectum. The pediatric  colonoscope was introduced through the anus and advanced to the cecum, which was identified by both the appendix and ileocecal valve. No adverse events experienced.   The quality of the prep was adequate  The instrument was then slowly withdrawn as the colon was fully examined. Estimated blood loss is zero unless otherwise noted in this procedure report.    Findings:  Normal DRE.  Prep quality was adequate.  Few medium-sized left-sided diverticula.  No polyps, masses, vascular ectasias, or inflammatory changes were seen.  Normal retroflexed view of rectum. Withdrawal time was 10 minutes     .  The scope was withdrawn and the procedure completed.  COMPLICATIONS:  ENDOSCOPIC IMPRESSION:     As above.  Left-sided diverticulosis. Otherwise unremarkable average-risk screening colonoscopy.   RECOMMENDATIONS:     1.  Watch for potential complications of procedure. 2.  Repeat screening colonoscopy in 10 years.  eSigned:  Willis ModenaWilliam Rahsaan Weakland, MD 12/23/2014 10:44 AM   cc:  CPT CODES: ICD CODES:  The ICD and CPT codes recommended by this software are interpretations from the data that the clinical staff has captured with the software.  The verification of the translation of this report to the ICD and CPT codes and modifiers  is the sole responsibility of the health care institution and practicing physician where this report was generated.  [employer] will not be held responsible for the validity of the ICD and CPT codes included on this report.  AMA assumes no liability for data contained or not contained herein. CPT is a Publishing rights managerregistered trademark of the [employer].

## 2014-12-23 NOTE — Anesthesia Preprocedure Evaluation (Addendum)
Anesthesia Evaluation  Patient identified by MRN, date of birth, ID band Patient awake    Reviewed: Allergy & Precautions, H&P , NPO status , Patient's Chart, lab work & pertinent test results  Airway Mallampati: II  TM Distance: >3 FB Neck ROM: full    Dental no notable dental hx. (+) Dental Advisory Given, Teeth Intact   Pulmonary neg pulmonary ROS,  breath sounds clear to auscultation  Pulmonary exam normal       Cardiovascular Exercise Tolerance: Good hypertension, Pt. on medications Normal cardiovascular examRhythm:regular Rate:Normal     Neuro/Psych Anxiety Depression Bipolar Disorder negative neurological ROS  negative psych ROS   GI/Hepatic negative GI ROS, Neg liver ROS, pancreatitis   Endo/Other  diabetes, Well Controlled, Type 2, Insulin Dependent  Renal/GU negative Renal ROS  negative genitourinary   Musculoskeletal   Abdominal   Peds  Hematology negative hematology ROS (+)   Anesthesia Other Findings   Reproductive/Obstetrics negative OB ROS                            Anesthesia Physical Anesthesia Plan  ASA: III  Anesthesia Plan: MAC   Post-op Pain Management:    Induction:   Airway Management Planned:   Additional Equipment:   Intra-op Plan:   Post-operative Plan:   Informed Consent: I have reviewed the patients History and Physical, chart, labs and discussed the procedure including the risks, benefits and alternatives for the proposed anesthesia with the patient or authorized representative who has indicated his/her understanding and acceptance.   Dental Advisory Given  Plan Discussed with: CRNA and Surgeon  Anesthesia Plan Comments:         Anesthesia Quick Evaluation

## 2014-12-23 NOTE — Anesthesia Postprocedure Evaluation (Signed)
  Anesthesia Post-op Note  Patient: [name]  Procedure(s) Performed: Procedure(s) (LRB): COLONOSCOPY WITH PROPOFOL (N/A) ESOPHAGOGASTRODUODENOSCOPY (EGD) WITH PROPOFOL (N/A) BALLOON DILATION (N/A)  Patient Location: PACU  Anesthesia Type: MAC  Level of Consciousness: awake and alert   Airway and Oxygen Therapy: Patient Spontanous Breathing  Post-op Pain: mild  Post-op Assessment: Post-op Vital signs reviewed, Patient's Cardiovascular Status Stable, Respiratory Function Stable, Patent Airway and No signs of Nausea or vomiting  Last Vitals:  Filed Vitals:   12/23/14 1046  BP:   Pulse: 95  Temp:   Resp: 15    Post-op Vital Signs: stable   Complications: No apparent anesthesia complications 

## 2014-12-24 ENCOUNTER — Encounter (HOSPITAL_COMMUNITY): Payer: Self-pay | Admitting: Gastroenterology

## 2014-12-28 LAB — GLUCOSE, CAPILLARY: GLUCOSE-CAPILLARY: 128 mg/dL — AB (ref 65–99)

## 2015-01-20 DIAGNOSIS — E669 Obesity, unspecified: Secondary | ICD-10-CM | POA: Diagnosis not present

## 2015-01-20 DIAGNOSIS — F3130 Bipolar disorder, current episode depressed, mild or moderate severity, unspecified: Secondary | ICD-10-CM | POA: Diagnosis not present

## 2015-01-20 DIAGNOSIS — E1140 Type 2 diabetes mellitus with diabetic neuropathy, unspecified: Secondary | ICD-10-CM | POA: Diagnosis not present

## 2015-01-20 DIAGNOSIS — E782 Mixed hyperlipidemia: Secondary | ICD-10-CM | POA: Diagnosis not present

## 2015-01-20 DIAGNOSIS — Z794 Long term (current) use of insulin: Secondary | ICD-10-CM | POA: Diagnosis not present

## 2015-01-20 DIAGNOSIS — Z6833 Body mass index (BMI) 33.0-33.9, adult: Secondary | ICD-10-CM | POA: Diagnosis not present

## 2015-01-20 DIAGNOSIS — I10 Essential (primary) hypertension: Secondary | ICD-10-CM | POA: Diagnosis not present

## 2015-02-12 ENCOUNTER — Other Ambulatory Visit: Payer: Self-pay | Admitting: Gastroenterology

## 2015-02-15 NOTE — Addendum Note (Signed)
Addended by: Amelita Risinger on: 02/15/2015 12:13 PM   Modules accepted: Orders  

## 2015-02-16 ENCOUNTER — Other Ambulatory Visit: Payer: Self-pay | Admitting: Gastroenterology

## 2015-02-16 ENCOUNTER — Encounter (HOSPITAL_COMMUNITY): Payer: Self-pay | Admitting: *Deleted

## 2015-02-16 NOTE — Addendum Note (Signed)
Addended by: Mariska Daffin on: 02/16/2015 05:39 PM   Modules accepted: Orders  

## 2015-02-17 ENCOUNTER — Encounter (HOSPITAL_COMMUNITY): Payer: Self-pay | Admitting: *Deleted

## 2015-02-17 ENCOUNTER — Encounter (HOSPITAL_COMMUNITY)
Admission: RE | Disposition: A | Discharge status: Home / Self Care | Payer: Self-pay | Source: Ambulatory Visit | Attending: Gastroenterology

## 2015-02-17 ENCOUNTER — Ambulatory Visit (HOSPITAL_COMMUNITY): Payer: Medicare Other | Admitting: Anesthesiology

## 2015-02-17 ENCOUNTER — Ambulatory Visit (HOSPITAL_COMMUNITY)
Admission: RE | Admit: 2015-02-17 | Discharge: 2015-02-17 | Disposition: A | Discharge status: Home / Self Care | Payer: Medicare Other | Source: Ambulatory Visit | Attending: Gastroenterology | Admitting: Gastroenterology

## 2015-02-17 ENCOUNTER — Ambulatory Visit (HOSPITAL_COMMUNITY): Payer: Medicare Other

## 2015-02-17 DIAGNOSIS — F319 Bipolar disorder, unspecified: Secondary | ICD-10-CM | POA: Insufficient documentation

## 2015-02-17 DIAGNOSIS — Z794 Long term (current) use of insulin: Secondary | ICD-10-CM | POA: Insufficient documentation

## 2015-02-17 DIAGNOSIS — E119 Type 2 diabetes mellitus without complications: Secondary | ICD-10-CM | POA: Diagnosis not present

## 2015-02-17 DIAGNOSIS — K222 Esophageal obstruction: Secondary | ICD-10-CM | POA: Diagnosis not present

## 2015-02-17 DIAGNOSIS — Z8669 Personal history of other diseases of the nervous system and sense organs: Secondary | ICD-10-CM | POA: Diagnosis not present

## 2015-02-17 DIAGNOSIS — M1990 Unspecified osteoarthritis, unspecified site: Secondary | ICD-10-CM | POA: Diagnosis not present

## 2015-02-17 DIAGNOSIS — Z7982 Long term (current) use of aspirin: Secondary | ICD-10-CM | POA: Diagnosis not present

## 2015-02-17 DIAGNOSIS — I10 Essential (primary) hypertension: Secondary | ICD-10-CM | POA: Insufficient documentation

## 2015-02-17 DIAGNOSIS — R1310 Dysphagia, unspecified: Secondary | ICD-10-CM

## 2015-02-17 DIAGNOSIS — Z79899 Other long term (current) drug therapy: Secondary | ICD-10-CM | POA: Insufficient documentation

## 2015-02-17 DIAGNOSIS — E780 Pure hypercholesterolemia: Secondary | ICD-10-CM | POA: Diagnosis not present

## 2015-02-17 DIAGNOSIS — R130 Aphagia: Secondary | ICD-10-CM | POA: Diagnosis not present

## 2015-02-17 HISTORY — PX: ESOPHAGOGASTRODUODENOSCOPY (EGD) WITH PROPOFOL: SHX5813

## 2015-02-17 HISTORY — PX: BALLOON DILATION: SHX5330

## 2015-02-17 SURGERY — ESOPHAGOGASTRODUODENOSCOPY (EGD) WITH PROPOFOL
Anesthesia: Monitor Anesthesia Care

## 2015-02-17 MED ORDER — PROPOFOL 10 MG/ML IV BOLUS
INTRAVENOUS | Status: AC
  Filled 2015-02-17: qty 20

## 2015-02-17 MED ORDER — LACTATED RINGERS IV SOLN
INTRAVENOUS | Status: DC
  Administered 2015-02-17: 1000 mL via INTRAVENOUS
  Administered 2015-02-17: 09:00:00 via INTRAVENOUS

## 2015-02-17 MED ORDER — PROPOFOL 500 MG/50ML IV EMUL
INTRAVENOUS | Status: DC | PRN
  Administered 2015-02-17 (×3): 80 mg via INTRAVENOUS

## 2015-02-17 MED ORDER — PROPOFOL INFUSION 10 MG/ML OPTIME
INTRAVENOUS | Status: DC | PRN
  Administered 2015-02-17: 200 ug/kg/min via INTRAVENOUS

## 2015-02-17 MED ORDER — ONDANSETRON HCL 4 MG/2ML IJ SOLN
INTRAMUSCULAR | Status: DC | PRN
  Administered 2015-02-17: 4 mg via INTRAVENOUS

## 2015-02-17 MED ORDER — SODIUM CHLORIDE 0.9 % IV SOLN: INTRAVENOUS | Status: DC

## 2015-02-17 SURGICAL SUPPLY — 14 items

## 2015-02-17 NOTE — Discharge Instructions (Signed)
°Esophageal Dilatation °The esophagus is the long, narrow tube which carries food and liquid from the mouth to the stomach. Esophageal dilatation is the technique used to stretch a blocked or narrowed portion of the esophagus. This procedure is used when a part of the esophagus has become so narrow that it becomes difficult, painful or even impossible to swallow. This is generally an uncomplicated form of treatment. When this is not successful, chest surgery may be required. This is a much more extensive form of treatment with a longer recovery time. °CAUSES  °Some of the more common causes of blockage or strictures of the esophagus are: °· Narrowing from longstanding inflammation (soreness and redness) of the lower esophagus. This comes from the constant exposure of the lower esophagus to the acid which bubbles up from the stomach. Over time this causes scarring and narrowing of the lower esophagus. °· Hiatal hernia in which a small part of the stomach bulges (herniates) up through the diaphragm. This can cause a gradual narrowing of the end of the esophagus. °· Schatzki ring is a narrow ring of benign (non-cancerous) fibrous tissue which constricts the lower esophagus. The reason for this is not known. °· Scleroderma is a connective tissue disorder that affects the esophagus and makes swallowing difficult. °· Achalasia is an absence of nerves to the lower esophagus and to the esophageal sphincter. This is the circular muscle between the stomach and esophagus that relaxes to allow food into the stomach. After swallowing, it contracts to keep food in the stomach. This absence of nerves may be congenital (present since birth). This can cause irregular spasms of the lower esophageal muscle. This spasm does not open up to allow food and fluid through. The result is a persistent blockage with subsequent slow trickling of the esophageal contents into the stomach. °· Strictures may develop from swallowing materials which  damage the esophagus. Some examples are strong acids or alkalis such as lye. °· Growths such as benign (non-cancerous) and malignant (cancerous) tumors can block the esophagus. °· Hereditary (present since birth) causes. °DIAGNOSIS  °Your caregiver often suspects this problem by taking a medical history. They will also do a physical exam. They can then prove their suspicions using X-rays and endoscopy. Endoscopy is an exam in which a tube like a small, flexible telescope is used to look at your esophagus.  °TREATMENT °There are different stretching (dilating) techniques that can be used. Simple bougie dilatation may be done in the office. This usually takes only a couple minutes. A numbing (anesthetic) spray of the throat is used. Endoscopy, when done, is done in an endoscopy suite under mild sedation. When fluoroscopy is used, the procedure is performed in X-ray. Other techniques require a little longer time. Recovery is usually quick. There is no waiting time to begin eating and drinking to test success of the treatment. Following are some of the methods used. °Narrowing of the esophagus is treated by making it bigger. °Commonly this is a mechanical problem which can be treated with stretching. This can be done in different ways. Your caregiver will discuss these with you. Some of the means used are: °· A series of graduated (increasing thickness) flexible dilators can be used. These are weighted tubes passed through the esophagus into the stomach. The tubes used become progressively larger until the desired stretched size is reached. Graduated dilators are a simple and quick way of opening the esophagus. No visualization is required. °· Another method is the use of endoscopy to place   a flexible wire across the stricture. The endoscope is removed and the wire left in place. A dilator with a hole through it from end to end is guided down the esophagus and across the stricture. One or more of these dilators are  passed over the wire. At the end of the exam, the wire is removed. This type of treatment may be performed in the X-ray department under fluoroscopy. An advantage of this procedure is the examiner is visualizing the end opening in the esophagus. °· Stretching of the esophagus may be done using balloons. Deflated balloons are placed through the endoscope and across the stricture. This type of balloon dilatation is often done at the time of endoscopy or fluoroscopy. Flexible endoscopy allows the examiner to directly view the stricture. A balloon is inserted in the deflated form into the area of narrowing. It is then inflated with air to a certain pressure that is preset for a given circumference. When inflated, it becomes sausage shaped, stretched, and makes the stricture larger. °· Achalasia requires a longer, larger balloon-type dilator. This is frequently done under X-ray control. In this situation, the spastic muscle fibers in the lower esophagus are stretched. °All of the above procedures make the passage of food and water into the stomach easier. They also make it easier for stomach contents to reflux back into the esophagus. Special medications may be used following the procedure to help prevent further stricturing. Proton-pump inhibitor medications are good at decreasing the amount of acid in the stomach juice. When stomach juice refluxes into the esophagus, the juice is no longer as acidic and is less likely to burn or scar the esophagus. °RISKS AND COMPLICATIONS °Esophageal dilatation is usually performed effectively and without problems. Some complications that can occur are: °· A small amount of bleeding almost always happens where the stretching takes place. If this is too excessive it may require more aggressive treatment. °· An uncommon complication is perforation (making a hole) of the esophagus. The esophagus is thin. It is easy to make a hole in it. If this happens, an operation may be necessary to  repair this. °· A small, undetected perforation could lead to an infection in the chest. This can be very serious. °HOME CARE INSTRUCTIONS  °· If you received sedation for your procedure, do not drive, make important decisions, or perform any activities requiring your full coordination. Do not drink alcohol, take sedatives, or use any mind altering chemicals unless instructed by your caregiver. °· You may use throat lozenges or warm salt water gargles if you have throat discomfort. °· You can begin eating and drinking normally on return home unless instructed otherwise. Do not purposely try to force large chunks of food down to test the benefits of your procedure. °· Mild discomfort can be eased with sips of ice water. °· Medications for discomfort may or may not be needed. °SEEK IMMEDIATE MEDICAL CARE IF:  °· You begin vomiting up blood. °· You develop black, tarry stools. °· You develop chills or an unexplained temperature of over 101°F (38.3°C) °· You develop chest or abdominal pain. °· You develop shortness of breath, or feel light-headed or faint. °· Your swallowing is becoming more painful, difficult, or you are unable to swallow. °MAKE SURE YOU:  °· Understand these instructions. °· Will watch your condition. °· Will get help right away if you are not doing well or get worse. °Document Released: 07/20/2005 Document Revised: 10/13/2013 Document Reviewed: 09/06/2005 °ExitCare® Patient Information ©2015 [employer].   This information is not intended to replace advice given to you by your health care provider. Make sure you discuss any questions you have with your health care provider. °Esophagogastroduodenoscopy °Care After °Refer to this sheet in the next few weeks. These instructions provide you with information on caring for yourself after your procedure. Your caregiver may also give you more specific instructions. Your treatment has been planned according to current medical practices, but problems sometimes  occur. Call your caregiver if you have any problems or questions after your procedure.  °HOME CARE INSTRUCTIONS °· Do not eat or drink anything until the numbing medicine (local anesthetic) has worn off and your gag reflex has returned. You will know that the local anesthetic has worn off when you can swallow comfortably. °· Do not drive for 12 hours after the procedure or as directed by your caregiver. °· Only take medicines as directed by your caregiver. °SEEK MEDICAL CARE IF:  °· You cannot stop coughing. °· You are not urinating at all or less than usual. °SEEK IMMEDIATE MEDICAL CARE IF: °· You have difficulty swallowing. °· You cannot eat or drink. °· You have worsening throat or chest pain. °· You have dizziness, lightheadedness, or you faint. °· You have nausea or vomiting. °· You have chills. °· You have a fever. °· You have severe abdominal pain. °· You have black, tarry, or bloody stools. °Document Released: 05/15/2012 Document Reviewed: 05/15/2012 °ExitCare® Patient Information ©2015 [employer]. This information is not intended to replace advice given to you by your health care provider. Make sure you discuss any questions you have with your health care provider. ° °

## 2015-02-17 NOTE — H&P (Signed)
Patient interval history reviewed.  Patient examined again.  There has been no change from documented H/P dated 02/11/15 (scanned into chart from our office) except as documented above.  Assessment:  1.  Dysphagia, known distal esophageal stricture.  Plan:  1.  Endoscopy with balloon dilatation. 2.  Risks (bleeding, infection, bowel perforation that could require surgery, sedation-related changes in cardiopulmonary systems), benefits (identification and possible treatment of source of symptoms, exclusion of certain causes of symptoms), and alternatives (watchful waiting, radiographic imaging studies, empiric medical treatment) of upper endoscopy with esophageal dilatation (EGD) were explained to patient/family in detail and patient wishes to proceed.

## 2015-02-17 NOTE — Transfer of Care (Signed)
Immediate Anesthesia Transfer of Care Note  Patient: [name]  Procedure(s) Performed: Procedure(s) with comments: ESOPHAGOGASTRODUODENOSCOPY (EGD) WITH PROPOFOL (N/A) - with balloon dilation BALLOON DILATION (N/A)  Patient Location: PACU  Anesthesia Type:MAC  Level of Consciousness:  sedated, patient cooperative and responds to stimulation  Airway & Oxygen Therapy:Patient Spontanous Breathing and Patient connected to face mask oxgen  Post-op Assessment:  Report given to PACU RN and Post -op Vital signs reviewed and stable  Post vital signs:  Reviewed and stable  Last Vitals:  Filed Vitals:   02/17/15 1016  BP: 158/52  Pulse: 85  Temp: 36.6 C  Resp: 16    Complications: No apparent anesthesia complications

## 2015-02-17 NOTE — Op Note (Signed)
[facility] [address] [place], [address]   ENDOSCOPY PROCEDURE REPORT  PATIENT: [name], [name]  MR#: [id number] BIRTHDATE: [birth year] , 59  yrs. old GENDER: female ENDOSCOPIST: Willis Modena, MD REFERRED BY:  Renford Dills, M.D. PROCEDURE DATE:  March 11, 2015 PROCEDURE:  Esophagoscopy w/ balloon dilation ASA CLASS:     Class III INDICATIONS:  dysphagia, distal esophageal stricture. MEDICATIONS: Monitored anesthesia care TOPICAL ANESTHETIC:  DESCRIPTION OF PROCEDURE: After the risks benefits and alternatives of the procedure were thoroughly explained, informed consent was obtained.  The Pentax Gastroscope [id number] endoscope was introduced through the mouth and advanced to the distal esophagus. The instrument was slowly withdrawn as the mucosa was fully examined. Estimated blood loss is zero unless otherwise noted in this procedure report.   Findings:  Distal esophageal stricture about 2cm proximal to the GE junction noted.  Diagnostic endoscope could not traverse the stricture.  Stricture appears a little less narrow, compared to appearance pre-dilatation in mid-July.  Stricture was serially dilated from 8 to 14 mm in 1mm increments; there was some mild-to-moderate mucosal disruption of the stricture, as to be expected, at the 13 and 14mm increments.                The scope was then withdrawn from the patient and the procedure completed.  COMPLICATIONS: There were no immediate complications.  ENDOSCOPIC IMPRESSION:     As above.  Benign-appearing distal esophageal stricture, dilated to 14mm (12mm last session).   RECOMMENDATIONS:     1.  Watch for potential complications of procedure. 2.  Soft diet today, gradually advance starting tomorrow as tolerated. 3.  Chew few well, slow eating, small bites. 4.  Repeat endoscopy 3-4 weeks for further dilatation (hopefully > 16mm, and at which point we can better inspect the distal esophagus as well as the  stomach and proximal duodenum.  Will do at hospital with fluoroscopy available.  eSigned:  Willis Modena, MD Mar 11, 2015 10:20 AM   CC:  CPT CODES: ICD CODES:  The ICD and CPT codes recommended by this software are interpretations from the data that the clinical staff has captured with the software.  The verification of the translation of this report to the ICD and CPT codes and modifiers is the sole responsibility of the health care institution and practicing physician where this report was generated.  [employer] will not be held responsible for the validity of the ICD and CPT codes included on this report.  AMA assumes no liability for data contained or not contained herein. CPT is a Publishing rights manager of the [employer].

## 2015-02-17 NOTE — Anesthesia Preprocedure Evaluation (Signed)
Anesthesia Evaluation  Patient identified by MRN, date of birth, ID band Patient awake    Reviewed: Allergy & Precautions, H&P , NPO status , Patient's Chart, lab work & pertinent test results  Airway Mallampati: II  TM Distance: >3 FB Neck ROM: full    Dental no notable dental hx. (+) Dental Advisory Given, Teeth Intact   Pulmonary neg pulmonary ROS,    Pulmonary exam normal breath sounds clear to auscultation       Cardiovascular Exercise Tolerance: Good hypertension, Pt. on medications Normal cardiovascular exam Rhythm:regular Rate:Normal     Neuro/Psych Anxiety Depression Bipolar Disorder negative neurological ROS  negative psych ROS   GI/Hepatic negative GI ROS, Neg liver ROS, dysphagia   Endo/Other  diabetes, Well Controlled, Type 2, Insulin Dependent  Renal/GU negative Renal ROS  negative genitourinary   Musculoskeletal  (+) Arthritis ,   Abdominal   Peds  Hematology negative hematology ROS (+)   Anesthesia Other Findings   Reproductive/Obstetrics                             Anesthesia Physical  Anesthesia Plan  ASA: III  Anesthesia Plan: MAC   Post-op Pain Management:    Induction:   Airway Management Planned: Natural Airway and Nasal Cannula  Additional Equipment:   Intra-op Plan:   Post-operative Plan:   Informed Consent: I have reviewed the patients History and Physical, chart, labs and discussed the procedure including the risks, benefits and alternatives for the proposed anesthesia with the patient or authorized representative who has indicated his/her understanding and acceptance.   Dental Advisory Given  Plan Discussed with: CRNA and Surgeon  Anesthesia Plan Comments:         Anesthesia Quick Evaluation

## 2015-02-17 NOTE — Anesthesia Postprocedure Evaluation (Signed)
  Anesthesia Post-op Note  Patient: [name]  Procedure(s) Performed: Procedure(s) with comments: ESOPHAGOGASTRODUODENOSCOPY (EGD) WITH PROPOFOL (N/A) - with balloon dilation BALLOON DILATION (N/A)  Patient Location: PACU  Anesthesia Type:MAC  Level of Consciousness: awake and alert   Airway and Oxygen Therapy: Patient Spontanous Breathing  Post-op Pain: none  Post-op Assessment: Post-op Vital signs reviewed              Post-op Vital Signs: Reviewed  Last Vitals:  Filed Vitals:   02/17/15 1016  BP: 158/52  Pulse: 85  Temp: 36.6 C  Resp: 16    Complications: No apparent anesthesia complications

## 2015-02-18 ENCOUNTER — Encounter (HOSPITAL_COMMUNITY): Payer: Self-pay | Admitting: Gastroenterology

## 2015-02-18 LAB — GLUCOSE, CAPILLARY: GLUCOSE-CAPILLARY: 212 mg/dL — AB (ref 65–99)

## 2015-02-25 DIAGNOSIS — F22 Delusional disorders: Secondary | ICD-10-CM | POA: Diagnosis not present

## 2015-03-24 ENCOUNTER — Other Ambulatory Visit: Payer: Self-pay | Admitting: Gastroenterology

## 2015-03-24 NOTE — Addendum Note (Signed)
Addended by: Cesily Cuoco on: 03/24/2015 11:39 AM   Modules accepted: Orders  

## 2015-03-31 ENCOUNTER — Encounter (HOSPITAL_COMMUNITY): Payer: Self-pay | Admitting: *Deleted

## 2015-04-06 ENCOUNTER — Other Ambulatory Visit: Payer: Self-pay | Admitting: Gastroenterology

## 2015-04-07 ENCOUNTER — Ambulatory Visit (HOSPITAL_COMMUNITY): Payer: Medicare Other | Admitting: Anesthesiology

## 2015-04-07 ENCOUNTER — Encounter (HOSPITAL_COMMUNITY)
Admission: RE | Disposition: A | Discharge status: Home / Self Care | Payer: Self-pay | Source: Ambulatory Visit | Attending: Gastroenterology

## 2015-04-07 ENCOUNTER — Encounter (HOSPITAL_COMMUNITY): Payer: Self-pay

## 2015-04-07 ENCOUNTER — Ambulatory Visit (HOSPITAL_COMMUNITY)
Admission: RE | Admit: 2015-04-07 | Discharge: 2015-04-07 | Disposition: A | Discharge status: Home / Self Care | Payer: Medicare Other | Source: Ambulatory Visit | Attending: Gastroenterology | Admitting: Gastroenterology

## 2015-04-07 DIAGNOSIS — R933 Abnormal findings on diagnostic imaging of other parts of digestive tract: Secondary | ICD-10-CM | POA: Diagnosis not present

## 2015-04-07 DIAGNOSIS — Z79899 Other long term (current) drug therapy: Secondary | ICD-10-CM | POA: Insufficient documentation

## 2015-04-07 DIAGNOSIS — E1136 Type 2 diabetes mellitus with diabetic cataract: Secondary | ICD-10-CM | POA: Diagnosis not present

## 2015-04-07 DIAGNOSIS — I10 Essential (primary) hypertension: Secondary | ICD-10-CM | POA: Insufficient documentation

## 2015-04-07 DIAGNOSIS — K222 Esophageal obstruction: Secondary | ICD-10-CM | POA: Diagnosis not present

## 2015-04-07 DIAGNOSIS — Z7982 Long term (current) use of aspirin: Secondary | ICD-10-CM | POA: Insufficient documentation

## 2015-04-07 DIAGNOSIS — R1310 Dysphagia, unspecified: Secondary | ICD-10-CM | POA: Diagnosis present

## 2015-04-07 DIAGNOSIS — Z794 Long term (current) use of insulin: Secondary | ICD-10-CM | POA: Diagnosis not present

## 2015-04-07 DIAGNOSIS — E7800 Pure hypercholesterolemia, unspecified: Secondary | ICD-10-CM | POA: Diagnosis not present

## 2015-04-07 DIAGNOSIS — F329 Major depressive disorder, single episode, unspecified: Secondary | ICD-10-CM | POA: Insufficient documentation

## 2015-04-07 DIAGNOSIS — R130 Aphagia: Secondary | ICD-10-CM | POA: Diagnosis not present

## 2015-04-07 HISTORY — DX: Other complications of anesthesia, initial encounter: T88.59XA

## 2015-04-07 HISTORY — DX: Adverse effect of unspecified anesthetic, initial encounter: T41.45XA

## 2015-04-07 HISTORY — PX: ESOPHAGOGASTRODUODENOSCOPY (EGD) WITH PROPOFOL: SHX5813

## 2015-04-07 HISTORY — PX: BALLOON DILATION: SHX5330

## 2015-04-07 LAB — GLUCOSE, CAPILLARY: GLUCOSE-CAPILLARY: 273 mg/dL — AB (ref 65–99)

## 2015-04-07 SURGERY — ESOPHAGOGASTRODUODENOSCOPY (EGD) WITH PROPOFOL
Anesthesia: Monitor Anesthesia Care

## 2015-04-07 MED ORDER — BUTAMBEN-TETRACAINE-BENZOCAINE 2-2-14 % EX AERO
INHALATION_SPRAY | CUTANEOUS | Status: DC | PRN
  Administered 2015-04-07: 2 via TOPICAL

## 2015-04-07 MED ORDER — SODIUM CHLORIDE 0.9 % IV SOLN: INTRAVENOUS | Status: DC

## 2015-04-07 MED ORDER — LIDOCAINE HCL (CARDIAC) 20 MG/ML IV SOLN
INTRAVENOUS | Status: AC
  Filled 2015-04-07: qty 5

## 2015-04-07 MED ORDER — LIDOCAINE HCL (CARDIAC) 20 MG/ML IV SOLN
INTRAVENOUS | Status: DC | PRN
  Administered 2015-04-07: 100 mg via INTRAVENOUS

## 2015-04-07 MED ORDER — ESMOLOL HCL 100 MG/10ML IV SOLN
INTRAVENOUS | Status: AC
  Filled 2015-04-07: qty 10

## 2015-04-07 MED ORDER — FENTANYL CITRATE (PF) 100 MCG/2ML IJ SOLN
INTRAMUSCULAR | Status: AC
  Filled 2015-04-07: qty 4

## 2015-04-07 MED ORDER — LACTATED RINGERS IV SOLN
INTRAVENOUS | Status: DC
  Administered 2015-04-07: 10:00:00 via INTRAVENOUS

## 2015-04-07 MED ORDER — PROMETHAZINE HCL 25 MG/ML IJ SOLN: 6.2500 mg | INTRAMUSCULAR | Status: DC | PRN

## 2015-04-07 MED ORDER — PROPOFOL 500 MG/50ML IV EMUL
INTRAVENOUS | Status: DC | PRN
  Administered 2015-04-07: 140 ug/kg/min via INTRAVENOUS

## 2015-04-07 MED ORDER — PROPOFOL 10 MG/ML IV BOLUS
INTRAVENOUS | Status: DC | PRN
  Administered 2015-04-07 (×3): 20 mg via INTRAVENOUS

## 2015-04-07 MED ORDER — PROPOFOL 10 MG/ML IV BOLUS
INTRAVENOUS | Status: AC
  Filled 2015-04-07: qty 20

## 2015-04-07 MED ORDER — ESMOLOL HCL 100 MG/10ML IV SOLN
INTRAVENOUS | Status: DC | PRN
  Administered 2015-04-07: 30 mg via INTRAVENOUS

## 2015-04-07 MED ORDER — FENTANYL CITRATE (PF) 100 MCG/2ML IJ SOLN
INTRAMUSCULAR | Status: DC | PRN
  Administered 2015-04-07: 50 ug via INTRAVENOUS
  Administered 2015-04-07: 25 ug via INTRAVENOUS

## 2015-04-07 SURGICAL SUPPLY — 14 items

## 2015-04-07 NOTE — Addendum Note (Signed)
Addended by: Shadd Dunstan on: 04/07/2015 09:24 AM   Modules accepted: Orders  

## 2015-04-07 NOTE — Op Note (Signed)
[facility] [address]  [place], [id number]   ENDOSCOPY PROCEDURE REPORT  PATIENT: [name], [name]  MR#: [id number] BIRTHDATE: 1955-10-13 , 59  yrs. old GENDER: female ENDOSCOPIST: Willis ModenaWilliam Yu Peggs, MD REFERRED BY:  Renford Dillsonald Polite, M.D. PROCEDURE DATE:  04/07/2015 PROCEDURE:  EGD w/ balloon dilation ASA CLASS:     Class II INDICATIONS:  dysphagia, esophageal stricture. MEDICATIONS: Monitored anesthesia care TOPICAL ANESTHETIC:  DESCRIPTION OF PROCEDURE: After the risks benefits and alternatives of the procedure were thoroughly explained, informed consent was obtained.  The Pentax Gastroscope [id number] endoscope was introduced through the mouth and advanced to the second portion of the duodenum. The instrument was slowly withdrawn as the mucosa was fully examined. Estimated blood loss is zero unless otherwise noted in this procedure report.    Findings:  Moderately tight stricture 1-2 cm upstream from the GE junction, slightly more stenotic compared to last procedure. Stricture serially dilated from 10 to 12 mm  in 1-mm increments with balloon dilating cathether.  There was some mucosal disruption post-dilation, as expected.  No underlying inflammation or mass was seen in the esophagus.  No obvious features of eosinophilic esophagitis were identified.   As opposed to last procedure, the diagnostic endoscope was able to traverse the esophagus into the stomach after dilatation.  Stomach, pylorus and duodenum to the second portion was normal.  Normal retroflexed view of the cardia.              The scope was then withdrawn from the patient and the procedure completed.  COMPLICATIONS: There were no immediate complications.  ENDOSCOPIC IMPRESSION:     Esophageal dilatation of distal esophageal stricture.  RECOMMENDATIONS:     1.  Watch for potential complications of procedure. 2.  Soft diet for 24 hours, gradually advance as tolerated. 3.  No ASA/NSAIDs x 3  days post-dilatation. 4.  We lost a bit of ground given the several-week span between her last dilatation.  As a result, would repeat dilatation in 2-3 weeks, in the hope that we can reach a goal esophageal dilatation of 15mm.  eSigned:  Willis ModenaWilliam Nikeisha Klutz, MD 04/07/2015 11:56 AM   CC:  CPT CODES: ICD CODES:  The ICD and CPT codes recommended by this software are interpretations from the data that the clinical staff has captured with the software.  The verification of the translation of this report to the ICD and CPT codes and modifiers is the sole responsibility of the health care institution and practicing physician where this report was generated.  [employer] will not be held responsible for the validity of the ICD and CPT codes included on this report.  AMA assumes no liability for data contained or not contained herein. CPT is a Publishing rights managerregistered trademark of the [employer].

## 2015-04-07 NOTE — Anesthesia Postprocedure Evaluation (Signed)
  Anesthesia Post-op Note  Patient: Maudie MercuryCynthia K Deskin  Procedure(s) Performed: Procedure(s) (LRB): ESOPHAGOGASTRODUODENOSCOPY (EGD) WITH PROPOFOL (N/A) BALLOON DILATION (N/A)  Patient Location: PACU  Anesthesia Type: MAC  Level of Consciousness: awake and alert   Airway and Oxygen Therapy: Patient Spontanous Breathing  Post-op Pain: mild  Post-op Assessment: Post-op Vital signs reviewed, Patient's Cardiovascular Status Stable, Respiratory Function Stable, Patent Airway and No signs of Nausea or vomiting  Last Vitals:  Filed Vitals:   04/07/15 1200  BP: 139/68  Pulse: 88  Temp:   Resp: 18    Post-op Vital Signs: stable   Complications: No apparent anesthesia complications

## 2015-04-07 NOTE — H&P (Signed)
Patient interval history reviewed.  Patient examined again.  There has been no change from documented H/P dated 04/06/15 (scanned into chart from our office) except as documented above.  Assessment:  1.  Dysphagia. 2.  Esophageal stricture.  Plan:  1.  Endoscopy with possible esophageal dilatation. 2.  Risks (bleeding, infection, bowel perforation that could require surgery, sedation-related changes in cardiopulmonary systems), benefits (identification and possible treatment of source of symptoms, exclusion of certain causes of symptoms), and alternatives (watchful waiting, radiographic imaging studies, empiric medical treatment) of upper endoscopy with possible esophageal dilatation (EGD +/- DIL) were explained to patient/family in detail and patient wishes to proceed.

## 2015-04-07 NOTE — Transfer of Care (Signed)
Immediate Anesthesia Transfer of Care Note  Patient: [name]  Procedure(s) Performed: Procedure(s): ESOPHAGOGASTRODUODENOSCOPY (EGD) WITH PROPOFOL (N/A) BALLOON DILATION (N/A)  Patient Location: Endoscopy Unit  Anesthesia Type:MAC  Level of Consciousness: awake, alert  and oriented  Airway & Oxygen Therapy: Patient Spontanous Breathing and Patient connected to nasal cannula oxygen  Post-op Assessment: Report given to RN and Post -op Vital signs reviewed and stable  Post vital signs: Reviewed and stable  Last Vitals:  Filed Vitals:   04/07/15 0954  BP: 145/70  Pulse: 90  Temp: 37.2 C  Resp: 16    Complications: No apparent anesthesia complications

## 2015-04-07 NOTE — Discharge Instructions (Signed)
Endoscopy Care After Please read the instructions outlined below and refer to this sheet in the next few weeks. These discharge instructions provide you with general information on caring for yourself after you leave the hospital. Your doctor may also give you specific instructions. While your treatment has been planned according to the most current medical practices available, unavoidable complications occasionally occur. If you have any problems or questions after discharge, please call Dr. Dulce Sellarutlaw [facility]) at [phone].  HOME CARE INSTRUCTIONS Activity  You may resume your regular activity but move at a slower pace for the next 24 hours.   Take frequent rest periods for the next 24 hours.   Walking will help expel (get rid of) the air and reduce the bloated feeling in your abdomen.   No driving for 24 hours (because of the anesthesia (medicine) used during the test).   You may shower.   Do not sign any important legal documents or operate any machinery for 24 hours (because of the anesthesia used during the test).  Nutrition  Drink plenty of fluids.   Soft diet today, resume slowly over the next 24 hours.   Begin with a light meal and progress to your normal diet.   Avoid alcoholic beverages for 24 hours or as instructed by your caregiver.  Medications You may resume your normal medications unless your caregiver tells you otherwise. What you can expect today  You may experience abdominal discomfort such as a feeling of fullness or "gas" pains.   You may experience a sore throat for 2 to 3 days. This is normal. Gargling with salt water may help this.    SEEK IMMEDIATE MEDICAL CARE IF:  You have excessive nausea (feeling sick to your stomach) and/or vomiting.   You have severe abdominal pain and distention (swelling).   You have trouble swallowing.   You have a temperature over 100 F (37.8 C).   You have rectal bleeding or vomiting of blood.    Document Released: 01/11/2004 Document Revised: 02/08/2011 Document Reviewed: 07/24/2007 [facility] Patient Information 2012 [place] [place].

## 2015-04-07 NOTE — Anesthesia Preprocedure Evaluation (Addendum)
Anesthesia Evaluation  Patient identified by MRN, date of birth, ID band Patient awake    Reviewed: Allergy & Precautions, NPO status , Patient's Chart, lab work & pertinent test results  Airway Mallampati: II  TM Distance: >3 FB Neck ROM: Full    Dental no notable dental hx.    Pulmonary neg pulmonary ROS,    Pulmonary exam normal breath sounds clear to auscultation       Cardiovascular hypertension, Pt. on medications Normal cardiovascular exam Rhythm:Regular Rate:Normal     Neuro/Psych Anxiety Depression Bipolar Disorder negative neurological ROS     GI/Hepatic negative GI ROS, Neg liver ROS,   Endo/Other  diabetes, Type 2, Oral Hypoglycemic Agents  Renal/GU negative Renal ROS  negative genitourinary   Musculoskeletal negative musculoskeletal ROS (+)   Abdominal   Peds negative pediatric ROS (+)  Hematology negative hematology ROS (+)   Anesthesia Other Findings   Reproductive/Obstetrics negative OB ROS                            Anesthesia Physical Anesthesia Plan  ASA: II  Anesthesia Plan: MAC   Post-op Pain Management:    Induction:   Airway Management Planned: Natural Airway  Additional Equipment:   Intra-op Plan:   Post-operative Plan:   Informed Consent: I have reviewed the patients History and Physical, chart, labs and discussed the procedure including the risks, benefits and alternatives for the proposed anesthesia with the patient or authorized representative who has indicated his/her understanding and acceptance.   Dental advisory given  Plan Discussed with: CRNA  Anesthesia Plan Comments:         Anesthesia Quick Evaluation                                  Anesthesia Evaluation  Patient identified by MRN, date of birth, ID band Patient awake    Reviewed: Allergy & Precautions, H&P , NPO status , Patient's Chart, lab work & pertinent test  results  Airway Mallampati: II  TM Distance: >3 FB Neck ROM: full    Dental no notable dental hx. (+) Dental Advisory Given, Teeth Intact   Pulmonary neg pulmonary ROS,    Pulmonary exam normal breath sounds clear to auscultation       Cardiovascular Exercise Tolerance: Good hypertension, Pt. on medications Normal cardiovascular exam Rhythm:regular Rate:Normal     Neuro/Psych Anxiety Depression Bipolar Disorder negative neurological ROS  negative psych ROS   GI/Hepatic negative GI ROS, Neg liver ROS, dysphagia   Endo/Other  diabetes, Well Controlled, Type 2, Insulin Dependent  Renal/GU negative Renal ROS  negative genitourinary   Musculoskeletal  (+) Arthritis ,   Abdominal   Peds  Hematology negative hematology ROS (+)   Anesthesia Other Findings   Reproductive/Obstetrics                             Anesthesia Physical  Anesthesia Plan  ASA: III  Anesthesia Plan: MAC   Post-op Pain Management:    Induction:   Airway Management Planned: Natural Airway and Nasal Cannula  Additional Equipment:   Intra-op Plan:   Post-operative Plan:   Informed Consent: I have reviewed the patients History and Physical, chart, labs and discussed the procedure including the risks, benefits and alternatives for the proposed anesthesia with the patient or authorized representative who has  indicated his/her understanding and acceptance.   Dental Advisory Given  Plan Discussed with: CRNA and Surgeon  Anesthesia Plan Comments:         Anesthesia Quick Evaluation

## 2015-04-08 ENCOUNTER — Encounter (HOSPITAL_COMMUNITY): Payer: Self-pay | Admitting: Gastroenterology

## 2015-04-08 NOTE — Addendum Note (Signed)
Addendum  created 04/08/15 1626 by Florene Routeiana L Hanifah Royse, CRNA   Modules edited: Anesthesia Attestations

## 2015-04-19 ENCOUNTER — Other Ambulatory Visit: Payer: Self-pay | Admitting: Gastroenterology

## 2015-04-19 NOTE — Addendum Note (Signed)
Addended by: Royanne Warshaw on: 04/19/2015 01:33 PM   Modules accepted: Orders  

## 2015-04-21 ENCOUNTER — Encounter (HOSPITAL_COMMUNITY): Payer: Self-pay | Admitting: *Deleted

## 2015-04-21 ENCOUNTER — Ambulatory Visit (HOSPITAL_COMMUNITY)
Admission: RE | Admit: 2015-04-21 | Discharge: 2015-04-21 | Disposition: A | Discharge status: Home / Self Care | Payer: Medicare Other | Source: Ambulatory Visit | Attending: Gastroenterology | Admitting: Gastroenterology

## 2015-04-21 ENCOUNTER — Encounter (HOSPITAL_COMMUNITY)
Admission: RE | Disposition: A | Discharge status: Home / Self Care | Payer: Self-pay | Source: Ambulatory Visit | Attending: Gastroenterology

## 2015-04-21 ENCOUNTER — Ambulatory Visit (HOSPITAL_COMMUNITY): Payer: Medicare Other

## 2015-04-21 ENCOUNTER — Ambulatory Visit (HOSPITAL_COMMUNITY): Payer: Medicare Other | Admitting: Registered Nurse

## 2015-04-21 DIAGNOSIS — M1990 Unspecified osteoarthritis, unspecified site: Secondary | ICD-10-CM | POA: Insufficient documentation

## 2015-04-21 DIAGNOSIS — E669 Obesity, unspecified: Secondary | ICD-10-CM | POA: Diagnosis not present

## 2015-04-21 DIAGNOSIS — Z6831 Body mass index (BMI) 31.0-31.9, adult: Secondary | ICD-10-CM | POA: Diagnosis not present

## 2015-04-21 DIAGNOSIS — Z794 Long term (current) use of insulin: Secondary | ICD-10-CM | POA: Diagnosis not present

## 2015-04-21 DIAGNOSIS — E1036 Type 1 diabetes mellitus with diabetic cataract: Secondary | ICD-10-CM | POA: Insufficient documentation

## 2015-04-21 DIAGNOSIS — E7800 Pure hypercholesterolemia, unspecified: Secondary | ICD-10-CM | POA: Insufficient documentation

## 2015-04-21 DIAGNOSIS — K449 Diaphragmatic hernia without obstruction or gangrene: Secondary | ICD-10-CM | POA: Diagnosis not present

## 2015-04-21 DIAGNOSIS — E119 Type 2 diabetes mellitus without complications: Secondary | ICD-10-CM | POA: Diagnosis not present

## 2015-04-21 DIAGNOSIS — K222 Esophageal obstruction: Secondary | ICD-10-CM | POA: Insufficient documentation

## 2015-04-21 DIAGNOSIS — F329 Major depressive disorder, single episode, unspecified: Secondary | ICD-10-CM | POA: Insufficient documentation

## 2015-04-21 DIAGNOSIS — R1310 Dysphagia, unspecified: Secondary | ICD-10-CM | POA: Diagnosis present

## 2015-04-21 DIAGNOSIS — I10 Essential (primary) hypertension: Secondary | ICD-10-CM | POA: Diagnosis not present

## 2015-04-21 DIAGNOSIS — K219 Gastro-esophageal reflux disease without esophagitis: Secondary | ICD-10-CM | POA: Diagnosis not present

## 2015-04-21 DIAGNOSIS — Z79899 Other long term (current) drug therapy: Secondary | ICD-10-CM | POA: Diagnosis not present

## 2015-04-21 DIAGNOSIS — Z7982 Long term (current) use of aspirin: Secondary | ICD-10-CM | POA: Insufficient documentation

## 2015-04-21 HISTORY — PX: ESOPHAGOGASTRODUODENOSCOPY (EGD) WITH PROPOFOL: SHX5813

## 2015-04-21 HISTORY — PX: BALLOON DILATION: SHX5330

## 2015-04-21 LAB — GLUCOSE, CAPILLARY: Glucose-Capillary: 225 mg/dL — ABNORMAL HIGH (ref 65–99)

## 2015-04-21 SURGERY — ESOPHAGOGASTRODUODENOSCOPY (EGD) WITH PROPOFOL
Anesthesia: Monitor Anesthesia Care

## 2015-04-21 MED ORDER — LIDOCAINE HCL (CARDIAC) 20 MG/ML IV SOLN
INTRAVENOUS | Status: AC
  Filled 2015-04-21: qty 5

## 2015-04-21 MED ORDER — LIDOCAINE HCL (CARDIAC) 20 MG/ML IV SOLN
INTRAVENOUS | Status: DC | PRN
  Administered 2015-04-21: 100 mg via INTRAVENOUS

## 2015-04-21 MED ORDER — PROPOFOL 10 MG/ML IV BOLUS
INTRAVENOUS | Status: DC | PRN
  Administered 2015-04-21 (×5): 20 mg via INTRAVENOUS

## 2015-04-21 MED ORDER — LACTATED RINGERS IV SOLN
Freq: Once | INTRAVENOUS | Status: AC
  Administered 2015-04-21: 1000 mL via INTRAVENOUS

## 2015-04-21 MED ORDER — SODIUM CHLORIDE 0.9 % IV SOLN: INTRAVENOUS | Status: DC

## 2015-04-21 MED ORDER — PROPOFOL 500 MG/50ML IV EMUL
INTRAVENOUS | Status: DC | PRN
  Administered 2015-04-21: 110 ug/kg/min via INTRAVENOUS

## 2015-04-21 MED ORDER — PROPOFOL 10 MG/ML IV BOLUS
INTRAVENOUS | Status: AC
  Filled 2015-04-21: qty 20

## 2015-04-21 MED ORDER — LACTATED RINGERS IV SOLN
INTRAVENOUS | Status: DC | PRN
  Administered 2015-04-21: 09:00:00 via INTRAVENOUS

## 2015-04-21 SURGICAL SUPPLY — 14 items

## 2015-04-21 NOTE — Discharge Instructions (Signed)
Endoscopy °Care After °Please read the instructions outlined below and refer to this sheet in the next few weeks. These discharge instructions provide you with general information on caring for yourself after you leave the hospital. Your doctor may also give you specific instructions. While your treatment has been planned according to the most current medical practices available, unavoidable complications occasionally occur. If you have any problems or questions after discharge, please call Dr. Egan Sahlin ([facility]) at [phone]. ° °HOME CARE INSTRUCTIONS °Activity °· You may resume your regular activity but move at a slower pace for the next 24 hours.  °· Take frequent rest periods for the next 24 hours.  °· Walking will help expel (get rid of) the air and reduce the bloated feeling in your abdomen.  °· No driving for 24 hours (because of the anesthesia (medicine) used during the test).  °· You may shower.  °· Do not sign any important legal documents or operate any machinery for 24 hours (because of the anesthesia used during the test).  °Nutrition °· Drink plenty of fluids.  °· You may resume your normal diet.  °· Begin with a light meal and progress to your normal diet.  °· Avoid alcoholic beverages for 24 hours or as instructed by your caregiver.  °Medications °You may resume your normal medications unless your caregiver tells you otherwise. °What you can expect today °· You may experience abdominal discomfort such as a feeling of fullness or "gas" pains.  °· You may experience a sore throat for 2 to 3 days. This is normal. Gargling with salt water may help this.  °·  °SEEK IMMEDIATE MEDICAL CARE IF: °· You have excessive nausea (feeling sick to your stomach) and/or vomiting.  °· You have severe abdominal pain and distention (swelling).  °· You have trouble swallowing.  °· You have a temperature over 100° F (37.8° C).  °· You have rectal bleeding or vomiting of blood.  °Document Released:  01/11/2004 Document Revised: 02/08/2011 Document Reviewed: 07/24/2007 °ExitCare® Patient Information ©2012 [employer]. °

## 2015-04-21 NOTE — Anesthesia Preprocedure Evaluation (Addendum)
Anesthesia Evaluation  Patient identified by MRN, date of birth, ID band Patient awake    Reviewed: Allergy & Precautions, NPO status , Patient's Chart, lab work & pertinent test results  History of Anesthesia Complications (+) history of anesthetic complications  Airway Mallampati: II  TM Distance: >3 FB Neck ROM: Full    Dental no notable dental hx.    Pulmonary neg pulmonary ROS,    Pulmonary exam normal breath sounds clear to auscultation       Cardiovascular Exercise Tolerance: Good hypertension, Pt. on medications Normal cardiovascular exam Rhythm:Regular Rate:Normal     Neuro/Psych PSYCHIATRIC DISORDERS Anxiety Depression Bipolar Disorder negative neurological ROS     GI/Hepatic Neg liver ROS, GERD  Medicated,  Endo/Other  diabetes, Type 1, Insulin Dependent  Renal/GU negative Renal ROS  negative genitourinary   Musculoskeletal  (+) Arthritis ,   Abdominal (+) + obese,   Peds negative pediatric ROS (+)  Hematology  (+) anemia ,   Anesthesia Other Findings   Reproductive/Obstetrics negative OB ROS                           Anesthesia Physical Anesthesia Plan  ASA: III  Anesthesia Plan: MAC   Post-op Pain Management:    Induction: Intravenous  Airway Management Planned: Natural Airway and Nasal Cannula  Additional Equipment:   Intra-op Plan:   Post-operative Plan:   Informed Consent: I have reviewed the patients History and Physical, chart, labs and discussed the procedure including the risks, benefits and alternatives for the proposed anesthesia with the patient or authorized representative who has indicated his/her understanding and acceptance.   Dental advisory given  Plan Discussed with: CRNA  Anesthesia Plan Comments:        Anesthesia Quick Evaluation

## 2015-04-21 NOTE — Anesthesia Postprocedure Evaluation (Signed)
  Anesthesia Post-op Note  Patient: [name]  Procedure(s) Performed: Procedure(s) (LRB): ESOPHAGOGASTRODUODENOSCOPY (EGD) WITH PROPOFOL (N/A) BALLOON DILATION (N/A)  Patient Location: PACU  Anesthesia Type: MAC  Level of Consciousness: awake and alert   Airway and Oxygen Therapy: Patient Spontanous Breathing  Post-op Pain: mild  Post-op Assessment: Post-op Vital signs reviewed, Patient's Cardiovascular Status Stable, Respiratory Function Stable, Patent Airway and No signs of Nausea or vomiting  Last Vitals:  Filed Vitals:   04/21/15 1043  BP: 126/61  Pulse:   Temp:   Resp: 16    Post-op Vital Signs: stable   Complications: No apparent anesthesia complications

## 2015-04-21 NOTE — Transfer of Care (Signed)
Immediate Anesthesia Transfer of Care Note  Patient: [name]  Procedure(s) Performed: Procedure(s): ESOPHAGOGASTRODUODENOSCOPY (EGD) WITH PROPOFOL (N/A) BALLOON DILATION (N/A)  Patient Location: PACU and Endoscopy Unit  Anesthesia Type:MAC  Level of Consciousness: awake, alert , oriented and patient cooperative  Airway & Oxygen Therapy: Patient Spontanous Breathing and Patient connected to nasal cannula oxygen  Post-op Assessment: Report given to RN, Post -op Vital signs reviewed and stable and Patient moving all extremities  Post vital signs: Reviewed and stable  Last Vitals:  Filed Vitals:   04/21/15 0845  BP: 154/73  Temp: 36.9 C  Resp: 14    Complications: No apparent anesthesia complications

## 2015-04-21 NOTE — Op Note (Signed)
[facility] [address] [place], [id number]   ENDOSCOPY PROCEDURE REPORT  PATIENT: Wendy MercuryWaters, [name]  MR#: [id number] BIRTHDATE: 1955/10/18 , 59  yrs. old GENDER: female ENDOSCOPIST: Willis ModenaWilliam Harriet Sutphen, MD REFERRED BY:  Renford Dillsonald Polite, M.D. PROCEDURE DATE:  04/21/2015 PROCEDURE:  EGD w/ balloon dilation ASA CLASS:     Class III INDICATIONS:  dysphagia, esophageal stricture. MEDICATIONS: Monitored anesthesia care TOPICAL ANESTHETIC: Cetacaine Spray  DESCRIPTION OF PROCEDURE: After the risks benefits and alternatives of the procedure were thoroughly explained, informed consent was obtained.  The Pentax Gastroscope [id number] endoscope was introduced through the mouth and advanced to the second portion of the duodenum. The instrument was slowly withdrawn as the mucosa was fully examined. Estimated blood loss is zero unless otherwise noted in this procedure report.    Findings:  Benign-appearing esophageal stricture much more patent, without ulceration or mass-like appearance.  Mucosal features not typical of eosinophilic esophagitis.  Small hiatal hernia. Stricture dilated serially to 14 mm with balloon catheter.  There was mild mucosal disruption, as expected, post-dilatation. Endoscopy, pre- and post-dilatation, was able to pass with ease through the esophagus, and into the stomach, pylorus, and duodenum, which were otherwise normal.                The scope was then withdrawn from the patient and the procedure completed.  COMPLICATIONS: There were no immediate complications.  ENDOSCOPIC IMPRESSION:     Benign-appearing distal esophageal stricture, suspect GERD-mediated, dilated.  RECOMMENDATIONS:     1.  Watch for potential complications of procedure. 2.  Soft diet today, gradually advance diet starting tomorrow. 3.  Prilosec 20 mg po qd x 12 weeks. 4.  Follow-up with [facility] in 2-3 months.  Don't anticipate need for further esophageal dilatation at the  present time, unless dysphagia symptoms recur.  eSigned:  Willis ModenaWilliam Quenna Doepke, MD 04/21/2015 10:21 AM   CC:  CPT CODES: ICD CODES:  The ICD and CPT codes recommended by this software are interpretations from the data that the clinical staff has captured with the software.  The verification of the translation of this report to the ICD and CPT codes and modifiers is the sole responsibility of the health care institution and practicing physician where this report was generated.  [employer] will not be held responsible for the validity of the ICD and CPT codes included on this report.  AMA assumes no liability for data contained or not contained herein. CPT is a Publishing rights managerregistered trademark of the [employer].

## 2015-04-21 NOTE — H&P (Signed)
Patient interval history reviewed.  Patient examined again.  There has been no change from documented H/P dated 04/06/15 (scanned into chart from our office) except as documented above.  Assessment:  1.  Dysphagia, with esophageal stricture, dilated.  Plan:  1.  Endoscopy with possible esophageal balloon dilatation. 2.  Risks (bleeding, infection, bowel perforation that could require surgery, sedation-related changes in cardiopulmonary systems), benefits (identification and possible treatment of source of symptoms, exclusion of certain causes of symptoms), and alternatives (watchful waiting, radiographic imaging studies, empiric medical treatment) of upper endoscopy with esophageal dilatation (EGD +/- DIL) were explained to patient/family in detail and patient wishes to proceed.

## 2015-04-22 ENCOUNTER — Encounter (HOSPITAL_COMMUNITY): Payer: Self-pay | Admitting: Gastroenterology

## 2015-04-30 ENCOUNTER — Emergency Department (HOSPITAL_COMMUNITY)
Admission: EM | Admit: 2015-04-30 | Discharge: 2015-04-30 | Disposition: A | Discharge status: Other Acute Inpatient Hospital | Payer: Medicare Other | Attending: Emergency Medicine | Admitting: Emergency Medicine

## 2015-04-30 ENCOUNTER — Encounter (HOSPITAL_COMMUNITY): Payer: Self-pay

## 2015-04-30 DIAGNOSIS — M1990 Unspecified osteoarthritis, unspecified site: Secondary | ICD-10-CM | POA: Insufficient documentation

## 2015-04-30 DIAGNOSIS — Z7982 Long term (current) use of aspirin: Secondary | ICD-10-CM | POA: Insufficient documentation

## 2015-04-30 DIAGNOSIS — Z79899 Other long term (current) drug therapy: Secondary | ICD-10-CM | POA: Insufficient documentation

## 2015-04-30 DIAGNOSIS — F22 Delusional disorders: Secondary | ICD-10-CM | POA: Diagnosis present

## 2015-04-30 DIAGNOSIS — I10 Essential (primary) hypertension: Secondary | ICD-10-CM | POA: Insufficient documentation

## 2015-04-30 DIAGNOSIS — F419 Anxiety disorder, unspecified: Secondary | ICD-10-CM | POA: Diagnosis not present

## 2015-04-30 DIAGNOSIS — K219 Gastro-esophageal reflux disease without esophagitis: Secondary | ICD-10-CM | POA: Insufficient documentation

## 2015-04-30 DIAGNOSIS — F329 Major depressive disorder, single episode, unspecified: Secondary | ICD-10-CM | POA: Diagnosis not present

## 2015-04-30 DIAGNOSIS — R739 Hyperglycemia, unspecified: Secondary | ICD-10-CM

## 2015-04-30 DIAGNOSIS — E7800 Pure hypercholesterolemia, unspecified: Secondary | ICD-10-CM | POA: Diagnosis not present

## 2015-04-30 DIAGNOSIS — E1165 Type 2 diabetes mellitus with hyperglycemia: Secondary | ICD-10-CM | POA: Insufficient documentation

## 2015-04-30 DIAGNOSIS — E119 Type 2 diabetes mellitus without complications: Secondary | ICD-10-CM | POA: Diagnosis present

## 2015-04-30 DIAGNOSIS — R45851 Suicidal ideations: Secondary | ICD-10-CM

## 2015-04-30 DIAGNOSIS — F23 Brief psychotic disorder: Secondary | ICD-10-CM | POA: Diagnosis not present

## 2015-04-30 DIAGNOSIS — Z862 Personal history of diseases of the blood and blood-forming organs and certain disorders involving the immune mechanism: Secondary | ICD-10-CM | POA: Diagnosis not present

## 2015-04-30 LAB — URINALYSIS, ROUTINE W REFLEX MICROSCOPIC
BILIRUBIN URINE: NEGATIVE
Hgb urine dipstick: NEGATIVE
KETONES UR: NEGATIVE mg/dL
Leukocytes, UA: NEGATIVE
Nitrite: NEGATIVE
PH: 5.5 (ref 5.0–8.0)
Protein, ur: NEGATIVE mg/dL
SPECIFIC GRAVITY, URINE: 1.02 (ref 1.005–1.030)

## 2015-04-30 LAB — URINE MICROSCOPIC-ADD ON
Bacteria, UA: NONE SEEN
RBC / HPF: NONE SEEN RBC/hpf (ref 0–5)

## 2015-04-30 LAB — CBC WITH DIFFERENTIAL/PLATELET
BASOS ABS: 0 10*3/uL (ref 0.0–0.1)
Basophils Relative: 0 %
Eosinophils Absolute: 0.3 10*3/uL (ref 0.0–0.7)
Eosinophils Relative: 5 %
HEMATOCRIT: 33.6 % — AB (ref 36.0–46.0)
HEMOGLOBIN: 11.2 g/dL — AB (ref 12.0–15.0)
LYMPHS PCT: 37 %
Lymphs Abs: 2.8 10*3/uL (ref 0.7–4.0)
MCH: 28.8 pg (ref 26.0–34.0)
MCHC: 33.3 g/dL (ref 30.0–36.0)
MCV: 86.4 fL (ref 78.0–100.0)
MONO ABS: 0.4 10*3/uL (ref 0.1–1.0)
Monocytes Relative: 5 %
NEUTROS PCT: 53 %
Neutro Abs: 4 10*3/uL (ref 1.7–7.7)
Platelets: 239 10*3/uL (ref 150–400)
RBC: 3.89 MIL/uL (ref 3.87–5.11)
RDW: 13.8 % (ref 11.5–15.5)
WBC: 7.6 10*3/uL (ref 4.0–10.5)

## 2015-04-30 LAB — RAPID URINE DRUG SCREEN, HOSP PERFORMED
AMPHETAMINES: NOT DETECTED
BARBITURATES: NOT DETECTED
BENZODIAZEPINES: NOT DETECTED
COCAINE: NOT DETECTED
Opiates: NOT DETECTED
TETRAHYDROCANNABINOL: NOT DETECTED

## 2015-04-30 LAB — CBG MONITORING, ED
GLUCOSE-CAPILLARY: 381 mg/dL — AB (ref 65–99)
GLUCOSE-CAPILLARY: 226 mg/dL — AB (ref 65–99)
GLUCOSE-CAPILLARY: 258 mg/dL — AB (ref 65–99)
GLUCOSE-CAPILLARY: 278 mg/dL — AB (ref 65–99)
Glucose-Capillary: 307 mg/dL — ABNORMAL HIGH (ref 65–99)

## 2015-04-30 LAB — COMPREHENSIVE METABOLIC PANEL
ALBUMIN: 3.2 g/dL — AB (ref 3.5–5.0)
ALT: 26 U/L (ref 14–54)
ANION GAP: 9 (ref 5–15)
AST: 27 U/L (ref 15–41)
Alkaline Phosphatase: 143 U/L — ABNORMAL HIGH (ref 38–126)
BILIRUBIN TOTAL: 0.3 mg/dL (ref 0.3–1.2)
BUN: 24 mg/dL — ABNORMAL HIGH (ref 6–20)
CO2: 21 mmol/L — ABNORMAL LOW (ref 22–32)
Calcium: 8.9 mg/dL (ref 8.9–10.3)
Chloride: 98 mmol/L — ABNORMAL LOW (ref 101–111)
Creatinine, Ser: 0.87 mg/dL (ref 0.44–1.00)
GFR calc Af Amer: >60 mL/min (ref >60)
GFR calc non Af Amer: >60 mL/min (ref >60)
GLUCOSE: 463 mg/dL — AB (ref 65–99)
POTASSIUM: 4.3 mmol/L (ref 3.5–5.1)
SODIUM: 128 mmol/L — AB (ref 135–145)
TOTAL PROTEIN: 6.5 g/dL (ref 6.5–8.1)

## 2015-04-30 LAB — BASIC METABOLIC PANEL
ANION GAP: 6 (ref 5–15)
BUN: 19 mg/dL (ref 6–20)
CALCIUM: 9.4 mg/dL (ref 8.9–10.3)
CO2: 26 mmol/L (ref 22–32)
CREATININE: 0.88 mg/dL (ref 0.44–1.00)
Chloride: 101 mmol/L (ref 101–111)
Glucose, Bld: 319 mg/dL — ABNORMAL HIGH (ref 65–99)
Potassium: 5 mmol/L (ref 3.5–5.1)
Sodium: 133 mmol/L — ABNORMAL LOW (ref 135–145)

## 2015-04-30 LAB — CARBAMAZEPINE LEVEL, TOTAL: Carbamazepine Lvl: 6.2 ug/mL (ref 4.0–12.0)

## 2015-04-30 LAB — ETHANOL: Alcohol, Ethyl (B): <5 mg/dL (ref <5)

## 2015-04-30 MED ORDER — CARBAMAZEPINE ER 200 MG PO TB12
200.0000 mg | ORAL_TABLET | Freq: Every day | ORAL | Status: DC
  Filled 2015-04-30: qty 1

## 2015-04-30 MED ORDER — INSULIN ASPART 100 UNIT/ML ~~LOC~~ SOLN
SUBCUTANEOUS | Status: AC
  Filled 2015-04-30: qty 1

## 2015-04-30 MED ORDER — LISINOPRIL 20 MG PO TABS
20.0000 mg | ORAL_TABLET | Freq: Every day | ORAL | Status: DC
  Administered 2015-04-30: 20 mg via ORAL
  Filled 2015-04-30: qty 1

## 2015-04-30 MED ORDER — ONDANSETRON HCL 4 MG PO TABS: 4.0000 mg | ORAL_TABLET | Freq: Three times a day (TID) | ORAL | Status: DC | PRN

## 2015-04-30 MED ORDER — SIMVASTATIN 20 MG PO TABS
20.0000 mg | ORAL_TABLET | Freq: Every day | ORAL | Status: DC
Start: 2015-04-30 — End: 2015-04-30
  Administered 2015-04-30: 20 mg via ORAL
  Filled 2015-04-30 (×2): qty 1

## 2015-04-30 MED ORDER — INSULIN ASPART 100 UNIT/ML ~~LOC~~ SOLN
10.0000 [IU] | Freq: Once | SUBCUTANEOUS | Status: AC
  Administered 2015-04-30: 10 [IU] via SUBCUTANEOUS

## 2015-04-30 MED ORDER — PAROXETINE HCL 20 MG PO TABS
40.0000 mg | ORAL_TABLET | Freq: Every day | ORAL | Status: DC
  Filled 2015-04-30: qty 2

## 2015-04-30 MED ORDER — INSULIN GLARGINE 100 UNIT/ML ~~LOC~~ SOLN
80.0000 [IU] | Freq: Every day | SUBCUTANEOUS | Status: DC
  Filled 2015-04-30: qty 0.8

## 2015-04-30 MED ORDER — BUPROPION HCL ER (XL) 150 MG PO TB24
150.0000 mg | ORAL_TABLET | Freq: Every day | ORAL | Status: DC
  Administered 2015-04-30: 150 mg via ORAL
  Filled 2015-04-30 (×2): qty 1

## 2015-04-30 MED ORDER — SODIUM CHLORIDE 0.9 % IV BOLUS (SEPSIS): 500.0000 mL | Freq: Once | INTRAVENOUS | Status: DC

## 2015-04-30 MED ORDER — ASPIRIN EC 81 MG PO TBEC
81.0000 mg | DELAYED_RELEASE_TABLET | Freq: Two times a day (BID) | ORAL | Status: DC
Start: 2015-04-30 — End: 2015-04-30
  Administered 2015-04-30: 81 mg via ORAL
  Filled 2015-04-30: qty 1

## 2015-04-30 MED ORDER — LORAZEPAM 1 MG PO TABS: 1.0000 mg | ORAL_TABLET | Freq: Three times a day (TID) | ORAL | Status: DC | PRN

## 2015-04-30 NOTE — ED Provider Notes (Signed)
CSN: [id number]     Arrival date & time 04/30/15  1324 History  By signing my name below, I, Tanda Rockers, attest that this documentation has been prepared under the direction and in the presence of Zadie Rhine, MD. Electronically Signed: Tanda Rockers, ED Scribe. 04/30/2015. 1:54 AM.  Chief Complaint  Patient presents with  . Psychiatric Evaluation   The history is provided by the patient. No language interpreter was used.     HPI Comments: [name] is a 59 y.o. female with hx anxiety and depression who presents to the Emergency Department for psychiatric evaluation. Pt states the people who live above her in her apartment complex are invading her privacy and the people who live across from her are helping the above neighbors do it. Pt states that there are videos that she is in that are going around town that are "sexually funny." She was driving today and the man in the car next to her looked at her and began busting out laughing, prompting pt to believe he saw the videos. She notes that these issues have been ongoing for 6 years now and she feels trapped and unsafe in her apartment complex. She has spoken with husband, psychiatrist, and police about these issues and reports no one believes her. Pt states that if she has to continue living in that condo then she will commit suicide. Pt denies any attempts of suicide today. She has tried to commit suicide twice in the past. Denies fever, nausea, vomiting, chest pain, abdominal pain, or any other associated symptoms. Pt is non EtOH drinker and denies any illicit drug use.  Her course is worsening Nothing improves her symptoms    Past Medical History  Diagnosis Date  . Diabetes mellitus without complication (HCC)   . Hypertension   . High cholesterol   . GERD (gastroesophageal reflux disease)   . Anxiety   . Arthritis     right little finger  . Anemia   . Pancreatitis     2 yrs ago- no problem now.  . Depression     hx  of suicide attempts in past (none for several years as of 10/12/14 - per pt)  . Complication of anesthesia     10/2014-facial swelling after surgery-noticed in recovery room   Past Surgical History  Procedure Laterality Date  . Shoulder surgery Left     repair tendon tear  . Tonsillectomy    . Abdominal hysterectomy    . Colonoscopy    . Back surgery      lumbar surgery  . Cholecystectomy      laparoscopic  . Colonoscopy with propofol N/A 12/23/2014    Procedure: COLONOSCOPY WITH PROPOFOL;  Surgeon: Willis Modena, MD;  Location: [facility];  Service: Endoscopy;  Laterality: N/A;  . Esophagogastroduodenoscopy (egd) with propofol N/A 12/23/2014    Procedure: ESOPHAGOGASTRODUODENOSCOPY (EGD) WITH PROPOFOL;  Surgeon: Willis Modena, MD;  Location: [facility];  Service: Endoscopy;  Laterality: N/A;  . Balloon dilation N/A 12/23/2014    Procedure: BALLOON DILATION;  Surgeon: Willis Modena, MD;  Location: [facility];  Service: Endoscopy;  Laterality: N/A;  . Esophagogastroduodenoscopy (egd) with propofol N/A 02/17/2015    Procedure: ESOPHAGOGASTRODUODENOSCOPY (EGD) WITH PROPOFOL;  Surgeon: Willis Modena, MD;  Location: [facility];  Service: Endoscopy;  Laterality: N/A;  with balloon dilation  . Balloon dilation N/A 02/17/2015    Procedure: BALLOON DILATION;  Surgeon: Willis Modena, MD;  Location: [facility];  Service: Endoscopy;  Laterality: N/A;  .  Esophagogastroduodenoscopy (egd) with propofol N/A 04/07/2015    Procedure: ESOPHAGOGASTRODUODENOSCOPY (EGD) WITH PROPOFOL;  Surgeon: Willis ModenaWilliam Outlaw, MD;  Location: [facility];  Service: Endoscopy;  Laterality: N/A;  . Balloon dilation N/A 04/07/2015    Procedure: BALLOON DILATION;  Surgeon: Willis ModenaWilliam Outlaw, MD;  Location: [facility];  Service: Endoscopy;  Laterality: N/A;  . Esophagogastroduodenoscopy (egd) with propofol N/A 04/21/2015    Procedure: ESOPHAGOGASTRODUODENOSCOPY (EGD) WITH PROPOFOL;  Surgeon: Willis ModenaWilliam Outlaw, MD;  Location: [facility];  Service: Endoscopy;  Laterality: N/A;  . Balloon dilation N/A 04/21/2015    Procedure: BALLOON DILATION;  Surgeon: Willis ModenaWilliam Outlaw, MD;  Location: [facility];  Service: Endoscopy;  Laterality: N/A;   Family History  Problem Relation Age of Onset  . Alzheimer's disease Mother   . Cancer - Lung Father    Social History  Substance Use Topics  . Smoking status: Never Smoker   . Smokeless tobacco: Never Used  . Alcohol Use: No   OB History    No data available     Review of Systems  Constitutional: Negative for fever.  Cardiovascular: Negative for chest pain.  Gastrointestinal: Negative for nausea, vomiting and abdominal pain.  Psychiatric/Behavioral: Positive for suicidal ideas. Negative for self-injury. The patient is nervous/anxious.   All other systems reviewed and are negative.   Allergies  Januvia  Home Medications   Prior to Admission medications   Medication Sig Start Date End Date Taking? Authorizing Provider  acetaminophen (TYLENOL) 500 MG tablet Take 1,000 mg by mouth every 6 (six) hours as needed (pain).   Yes Historical Provider, MD  aspirin EC 81 MG tablet Take 81 mg by mouth 2 (two) times daily.   Yes Historical Provider, MD  buPROPion (WELLBUTRIN XL) 150 MG 24 hr tablet Take 150 mg by mouth daily.    Yes Historical Provider, MD  carbamazepine (TEGRETOL XR) 200 MG 12 hr tablet Take 200 mg by mouth at bedtime.   Yes Historical Provider, MD  cetirizine (ZYRTEC) 10 MG tablet Take 1 tablet (10 mg total) by mouth daily. Patient taking differently: Take 10 mg by mouth 2 (two) times daily.  03/24/14  Yes Adonis BrookSheila Agustin, NP  LANTUS SOLOSTAR 100 UNIT/ML Solostar Pen Inject 80 Units into the skin at bedtime. 02/03/15  Yes Historical Provider, MD  lisinopril (PRINIVIL,ZESTRIL) 20 MG tablet Take 1 tablet (20 mg total) by mouth daily. 03/24/14  Yes Adonis BrookSheila Agustin, NP  LORazepam (ATIVAN) 0.5 MG tablet Take 0.5 mg by mouth at bedtime as needed for anxiety or sleep.     Yes Historical Provider, MD  omeprazole (PRILOSEC OTC) 20 MG tablet Take 1 tablet (20 mg total) by mouth daily. Patient taking differently: Take 20 mg by mouth 2 (two) times daily.  03/24/14  Yes Adonis BrookSheila Agustin, NP  PARoxetine (PAXIL) 40 MG tablet Take 1.5 tablets (60 mg total) by mouth at bedtime. Patient taking differently: Take 40 mg by mouth at bedtime.  03/24/14  Yes Adonis BrookSheila Agustin, NP  simvastatin (ZOCOR) 20 MG tablet Take 1 tablet (20 mg total) by mouth daily. Patient taking differently: Take 20 mg by mouth at bedtime.  03/24/14  Yes Adonis BrookSheila Agustin, NP   Triage Vitals: BP 160/91 mmHg  Pulse 124  Temp(Src) 98.6 F (37 C) (Oral)  Resp 16  SpO2 98%   Physical Exam  Nursing note and vitals reviewed.  CONSTITUTIONAL: Well developed/well nourished HEAD: Normocephalic/atraumatic EYES: EOMI/PERRL ENMT: Mucous membranes moist NECK: supple no meningeal signs CV: S1/S2 noted, no murmurs/rubs/gallops noted LUNGS: Lungs are  clear to auscultation bilaterally, no apparent distress ABDOMEN: soft, nontender, no rebound or guarding, bowel sounds noted throughout abdomen NEURO: Pt is awake/alert/appropriate, moves all extremitiesx4.  No facial droop.   EXTREMITIES: pulses normal/equal, full ROM SKIN: warm, color normal PSYCH: flat affect  ED Course  Procedures  Medications  ondansetron (ZOFRAN) tablet 4 mg (not administered)  LORazepam (ATIVAN) tablet 1 mg (not administered)  aspirin EC tablet 81 mg (not administered)  buPROPion (WELLBUTRIN XL) 24 hr tablet 150 mg (not administered)  carbamazepine (TEGRETOL XR) 12 hr tablet 200 mg (not administered)  PARoxetine (PAXIL) tablet 40 mg (not administered)  simvastatin (ZOCOR) tablet 20 mg (not administered)  lisinopril (PRINIVIL,ZESTRIL) tablet 20 mg (not administered)  insulin glargine (LANTUS) injection 80 Units (not administered)  insulin aspart (novoLOG) injection 10 Units (10 Units Subcutaneous Given 04/30/15 0250)     DIAGNOSTIC  STUDIES: Oxygen Saturation is 98% on RA, normal by my interpretation.    COORDINATION OF CARE: 1:53 AM-Discussed treatment plan which includes urine microscopic, CMP, CBC, EtOH, rapid drug screen, carbamazepine level, UA, and consult with TTS with pt at bedside and pt agreed to plan.  5:40 AM Vitals improved BP 134/72 mmHg  Pulse 111  Temp(Src) 98.7 F (37.1 C) (Oral)  Resp 16  SpO2 96% Pt with hyperglycemia WITHOUT anion gap Mild hyPOnatremia likely due to glucose level Pt is nontoxic  She is NOT in DKA She is medically stable Home meds ordered She is currently awaiting placement  Labs Review Labs Reviewed  COMPREHENSIVE METABOLIC PANEL - Abnormal; Notable for the following:    Sodium 128 (*)    Chloride 98 (*)    CO2 21 (*)    Glucose, Bld 463 (*)    BUN 24 (*)    Albumin 3.2 (*)    Alkaline Phosphatase 143 (*)    All other components within normal limits  CBC WITH DIFFERENTIAL/PLATELET - Abnormal; Notable for the following:    Hemoglobin 11.2 (*)    HCT 33.6 (*)    All other components within normal limits  URINALYSIS, ROUTINE W REFLEX MICROSCOPIC (NOT AT [facility]) - Abnormal; Notable for the following:    Glucose, UA >1000 (*)    All other components within normal limits  URINE MICROSCOPIC-ADD ON - Abnormal; Notable for the following:    Squamous Epithelial / LPF 0-5 (*)    All other components within normal limits  CBG MONITORING, ED - Abnormal; Notable for the following:    Glucose-Capillary 381 (*)    All other components within normal limits  CBG MONITORING, ED - Abnormal; Notable for the following:    Glucose-Capillary 307 (*)    All other components within normal limits  ETHANOL  URINE RAPID DRUG SCREEN, HOSP PERFORMED  CARBAMAZEPINE LEVEL, TOTAL    I have personally reviewed and evaluated these lab results as part of my medical decision-making.   MDM   Final diagnoses:  Suicidal ideation  Hyperglycemia    Nursing notes including past medical  history and social history reviewed and considered in documentation Labs/vital reviewed myself and considered during evaluation   I personally performed the services described in this documentation, which was scribed in my presence. The recorded information has been reviewed and is accurate.        Zadie Rhine, MD 04/30/15 [phone]

## 2015-04-30 NOTE — ED Provider Notes (Signed)
[facility]  Wendy LovelessScott Jamair Cato, MD 04/30/15 [phone]

## 2015-04-30 NOTE — ED Notes (Signed)
Sitter at bedside.

## 2015-04-30 NOTE — ED Notes (Signed)
Spoke with pharmacy they will be sending her medications

## 2015-04-30 NOTE — ED Notes (Signed)
Faxed paperwork to [facility] @ [phone]

## 2015-04-30 NOTE — ED Notes (Signed)
Dr. Jodi MourningZavitz made aware of pt. CBG. Ok to continue pt. Daily insulin regimen, will re-evaluate this afternoon.

## 2015-04-30 NOTE — ED Notes (Signed)
Husband at bedside; informed husband of disposition plans and gave Pod C guidelines with visiting hours. Husband and pt expressed understanding of guidelines.

## 2015-04-30 NOTE — ED Notes (Signed)
Pt undergoing TTS consult.  

## 2015-04-30 NOTE — ED Notes (Addendum)
Pt reports there are people who live above her in her apartment are invading her privacy and she does not feel safe at home. Pt presents with persecutory delusions and is seeking a psychiatry evaluation. She reports there are multiple people telling her this not happening but she is convinced that it is. Pt is denying SI/HI.

## 2015-04-30 NOTE — ED Notes (Signed)
Staffing has been called, sitter en route

## 2015-04-30 NOTE — ED Notes (Signed)
Dr. Criss AlvineGoldston updated pt. On plan of care.   She verbalized understanding of going to [facility] for Specialized care.

## 2015-04-30 NOTE — ED Notes (Signed)
New BMP results to be faxed to [facility] before accepting pt per CSW Delft ColonyMegan.  New results faxed.  MD Zavitz aware of new results.

## 2015-04-30 NOTE — BH Assessment (Addendum)
Tele Assessment Note   [name] is a Caucasian, married 59 y.o. female w/hx of depression and anxiety presenting to [facility] c/o suicidal ideations and fixed persecutory delusions. Pt reports that 2 different sets of neighbors at her apt complex are working together to install cameras in her apt to record her and make sexually explicit videos to distribute around town. Pt seems to be experiencing AH, as she states that she can hear them hammering and running the wires through her walls and ceilings. She also thinks people break into her apt when she's not there and move things around. Pt says she has gone to the lengths of bathing in the dark and going to the bathroom with an umbrella over her to hide herself. Pt says she knows that these videos are out there because people will recognize her and point, laugh, and even call her a prostitute. Pt states "I'm coming unglued" and "I'd rather be dead or someone just chop my head off if they don't stop this". Pt reports SI with plans to overdose if the invasion of privacy does not stop. She says it's been going on since 2003. Pt says she fears that someone could attack her because she is in these videos. She reports telling family, her psychiatrist, her landlord, multiple mental health professionals, and the police about what is going on and they all tell her she is delusional and don't attempt to help her in any way. Pt says "These are not delusions. It's really happening." Even when neighbors move out of the suspected apartments, the pt believes that the new residents are doing the same thing. Pt reports a hx of HI towards her neighbors, stating that she once wanted to get a gun and go upstairs to the apt and "just start shooting and let the bodies pile up". She denies current HI, A/VH, self-harming behaviors, hx of abuse, or SA.   Pt is under the care of Dr Donell Beers, whom she claims has also told her that she is experiencing delusions. Pt quit going to  therapist because none of them believe her. Pt has a hx of several inpt admissions to [facility], in 2015, 2010, and 2004. Two of these admissions were due to suicide attempts by overdose. Pt is alert and oriented x3. She is cooperative with assessment and forthcoming with information. She presents with disheveled appearance, good eye-contact, and restlessness. Speech is of normal rate and tone. Thought process is coherent but clearly delusional in content. Mood is depressed and suspicious and affect is labile, as pt laughs inappropriately at times and becomes tearful, suspicious, and/or irritable at others. Pt appears to have very little insight in to her delusions. UDS and BAL are clear.  - Per Hulan Fess, NP, Pt meets inpt criteria. Appropriate for 500-hall bed but none at [facility]. TTS to seek placement.   Diagnosis: 296.34 Major depressive disorder, Recurrent episode, With psychotic features  Past Medical History:  Past Medical History  Diagnosis Date  . Diabetes mellitus without complication (HCC)   . Hypertension   . High cholesterol   . GERD (gastroesophageal reflux disease)   . Anxiety   . Arthritis     right little finger  . Anemia   . Pancreatitis     2 yrs ago- no problem now.  . Depression     hx of suicide attempts in past (none for several years as of 10/12/14 - per pt)  . Complication of anesthesia  10/2014-facial swelling after surgery-noticed in recovery room    Past Surgical History  Procedure Laterality Date  . Shoulder surgery Left     repair tendon tear  . Tonsillectomy    . Abdominal hysterectomy    . Colonoscopy    . Back surgery      lumbar surgery  . Cholecystectomy      laparoscopic  . Colonoscopy with propofol N/A 12/23/2014    Procedure: COLONOSCOPY WITH PROPOFOL;  Surgeon: Willis Modena, MD;  Location: [facility];  Service: Endoscopy;  Laterality: N/A;  . Esophagogastroduodenoscopy (egd) with propofol N/A 12/23/2014    Procedure:  ESOPHAGOGASTRODUODENOSCOPY (EGD) WITH PROPOFOL;  Surgeon: Willis Modena, MD;  Location: [facility];  Service: Endoscopy;  Laterality: N/A;  . Balloon dilation N/A 12/23/2014    Procedure: BALLOON DILATION;  Surgeon: Willis Modena, MD;  Location: [facility];  Service: Endoscopy;  Laterality: N/A;  . Esophagogastroduodenoscopy (egd) with propofol N/A 02/17/2015    Procedure: ESOPHAGOGASTRODUODENOSCOPY (EGD) WITH PROPOFOL;  Surgeon: Willis Modena, MD;  Location: [facility];  Service: Endoscopy;  Laterality: N/A;  with balloon dilation  . Balloon dilation N/A 02/17/2015    Procedure: BALLOON DILATION;  Surgeon: Willis Modena, MD;  Location: [facility];  Service: Endoscopy;  Laterality: N/A;  . Esophagogastroduodenoscopy (egd) with propofol N/A 04/07/2015    Procedure: ESOPHAGOGASTRODUODENOSCOPY (EGD) WITH PROPOFOL;  Surgeon: Willis Modena, MD;  Location: [facility];  Service: Endoscopy;  Laterality: N/A;  . Balloon dilation N/A 04/07/2015    Procedure: BALLOON DILATION;  Surgeon: Willis Modena, MD;  Location: [facility];  Service: Endoscopy;  Laterality: N/A;  . Esophagogastroduodenoscopy (egd) with propofol N/A 04/21/2015    Procedure: ESOPHAGOGASTRODUODENOSCOPY (EGD) WITH PROPOFOL;  Surgeon: Willis Modena, MD;  Location: [facility];  Service: Endoscopy;  Laterality: N/A;  . Balloon dilation N/A 04/21/2015    Procedure: BALLOON DILATION;  Surgeon: Willis Modena, MD;  Location: [facility];  Service: Endoscopy;  Laterality: N/A;    Family History:  Family History  Problem Relation Age of Onset  . Alzheimer's disease Mother   . Cancer - Lung Father     Social History:  reports that she has never smoked. She has never used smokeless tobacco. She reports that she does not drink alcohol or use illicit drugs.  Additional Social History:  Alcohol / Drug Use Pain Medications: See PTA med list Prescriptions: See PTA med list Over the Counter: See PTA med list History of alcohol /  drug use?: No history of alcohol / drug abuse  CIWA: CIWA-Ar BP: 160/91 mmHg Pulse Rate: (!) 124 COWS:    PATIENT STRENGTHS: (choose at least two) Ability for insight Average or above average intelligence Capable of independent living Communication skills Supportive family/friends  Allergies:  Allergies  Allergen Reactions  . Januvia [Sitagliptin] Other (See Comments)    Hx of pancreatitis    Home Medications:  (Not in a hospital admission)  OB/GYN Status:  No LMP recorded. Patient has had a hysterectomy.  General Assessment Data Location of Assessment: [facility] ED TTS Assessment: In system Is this a Tele or Face-to-Face Assessment?: Tele Assessment Is this an Initial Assessment or a Re-assessment for this encounter?: Initial Assessment Marital status: Married Is patient pregnant?: No Pregnancy Status: No Living Arrangements: Spouse/significant other Can pt return to current living arrangement?: Yes Admission Status: Voluntary Is patient capable of signing voluntary admission?: Yes Referral Source: Self/Family/Friend Insurance type: Medicare     Crisis Care Plan Living Arrangements: Spouse/significant other Name of Psychiatrist: Dr Donell Beers Name of  Therapist: None  Education Status Is patient currently in school?: No Current Grade: na Highest grade of school patient has completed: na Name of school: na Contact person: na  Risk to self with the past 6 months Suicidal Ideation: Yes-Currently Present Has patient been a risk to self within the past 6 months prior to admission? : No Suicidal Intent: No Has patient had any suicidal intent within the past 6 months prior to admission? : No Is patient at risk for suicide?: Yes Suicidal Plan?: Yes-Currently Present Has patient had any suicidal plan within the past 6 months prior to admission? : No Specify Current Suicidal Plan: OD on medications Access to Means: Yes Specify Access to Suicidal Means: Access to  medications What has been your use of drugs/alcohol within the last 12 months?: None Previous Attempts/Gestures: Yes How many times?: 2 Other Self Harm Risks: None known Triggers for Past Attempts: Other (Comment) (Persecutory delusions) Intentional Self Injurious Behavior: None Family Suicide History: No Recent stressful life event(s): Other (Comment) (Pt's beliefs that her neighbors are watching her) Persecutory voices/beliefs?: Yes Depression: Yes Depression Symptoms: Despondent, Insomnia, Tearfulness, Isolating, Fatigue, Loss of interest in usual pleasures, Feeling angry/irritable Substance abuse history and/or treatment for substance abuse?: No Suicide prevention information given to non-admitted patients: Not applicable  Risk to Others within the past 6 months Homicidal Ideation: No-Not Currently/Within Last 6 Months Does patient have any lifetime risk of violence toward others beyond the six months prior to admission? : No Thoughts of Harm to Others: No-Not Currently Present/Within Last 6 Months Current Homicidal Intent: No Current Homicidal Plan: No Access to Homicidal Means: No Identified Victim: Pt's neighbors History of harm to others?: No Assessment of Violence: None Noted Violent Behavior Description: No known hx of violence. Pt reports a hx of thoughts to shoot the neighbors she believes are spying on her, but no current HI. Does patient have access to weapons?: No Criminal Charges Pending?: No Does patient have a court date: No Is patient on probation?: No  Psychosis Hallucinations: Auditory Delusions: Persecutory  Mental Status Report Appearance/Hygiene: Disheveled Eye Contact: Good Motor Activity: Restlessness Speech: Logical/coherent Level of Consciousness: Alert Mood: Depressed, Suspicious Affect: Labile Anxiety Level: Moderate Thought Processes: Coherent (Delusional thought content) Judgement: Partial Orientation: Person, Place, Time Obsessive  Compulsive Thoughts/Behaviors: None  Cognitive Functioning Concentration: Good Memory: Recent Intact IQ: Average Insight: Poor Impulse Control: Fair Appetite: Good Weight Loss: 0 Weight Gain: 0 Sleep: Decreased Total Hours of Sleep: 6 Vegetative Symptoms: None  ADLScreening [facility] Assessment Services) Patient's cognitive ability adequate to safely complete daily activities?: Yes Patient able to express need for assistance with ADLs?: Yes Independently performs ADLs?: Yes (appropriate for developmental age)  Prior Inpatient Therapy Prior Inpatient Therapy: Yes Prior Therapy Dates: 2015, 2010, 2004 Prior Therapy Facilty/Provider(s): [facility] Reason for Treatment: Psychosis, Depression  Prior Outpatient Therapy Prior Outpatient Therapy: Yes Prior Therapy Dates: Current Prior Therapy Facilty/Provider(s): Dr Donell Beers Reason for Treatment: Med management Does patient have an ACCT team?: No Does patient have Intensive In-House Services?  : No Does patient have [facility] services? : No Does patient have P4CC services?: No  ADL Screening (condition at time of admission) Patient's cognitive ability adequate to safely complete daily activities?: Yes Is the patient deaf or have difficulty hearing?: No Does the patient have difficulty seeing, even when wearing glasses/contacts?: No Does the patient have difficulty concentrating, remembering, or making decisions?: No Patient able to express need for assistance with ADLs?: Yes Does the patient have  difficulty dressing or bathing?: No Independently performs ADLs?: Yes (appropriate for developmental age) Does the patient have difficulty walking or climbing stairs?: No Weakness of Legs: None Weakness of Arms/Hands: None  Home Assistive Devices/Equipment Home Assistive Devices/Equipment: None    Abuse/Neglect Assessment (Assessment to be complete while patient is alone) Physical Abuse: Denies Verbal Abuse: Denies Sexual Abuse:  Denies Exploitation of patient/patient's resources: Denies Self-Neglect: Denies Values / Beliefs Cultural Requests During Hospitalization: None Spiritual Requests During Hospitalization: None   Advance Directives (For Healthcare) Does patient have an advance directive?: No Would patient like information on creating an advanced directive?: No - patient declined information    Additional Information 1:1 In Past 12 Months?: No CIRT Risk: No Elopement Risk: No Does patient have medical clearance?: No     Disposition: Per Hulan FessIjeoma Nwaeze, NP, Pt meets inpt criteria. Appropriate for 500-hall bed but [facility]. TTS to seek placement. Disposition Initial Assessment Completed for this Encounter: Yes Disposition of Patient: Inpatient treatment program Type of inpatient treatment program: Adult  Cyndie Mullnna Meet [place], [facility]  04/30/2015 4:25 AM

## 2015-04-30 NOTE — Progress Notes (Signed)
Seeking inpatient psychiatric treatment for pt as recommended by psychiatry.  Considered [facility] upon appropriate bed availability  Referred to: [facility]- per Nila Nephewameka Rowan- [facility]- per Zadie Rhinealvin Davis- per Montine Circleassie   Tanika Bracco, MSW, LCSW Clinical Social Work, Disposition  04/30/2015 [phone]

## 2015-04-30 NOTE — Progress Notes (Signed)
[facility] at [facility] called advised pt's referral is being reviewed and they request information re: pt's low sodium reading and commitment status.   CSW spoke with Dr. Patricia NettleZabitz- was advised this is being addressed. RN advised pt is awaiting placement voluntarily and agrees to inpatient treatment.  Informed [facility]. Advised that pt has been accepted by Dr. Althea Grimmerosado, and report can be called at [phone] when pt is prepared for transportation. States they will need repeat sodium results faxed to them for review prior to transport- [phone].  Spoke with ED RN re: pt's placement.  Ilean SkillMeghan Staci Dack, MSW, LCSW Clinical Social Work, Disposition  04/30/2015 [phone]

## 2015-04-30 NOTE — ED Notes (Signed)
Pt. S husband was given directions , address and phone number of  Facility that pt. Is going to

## 2015-04-30 NOTE — [facility] Counselor (Signed)
Disposition: Per Hulan FessIjeoma Nwaeze, NP, Pt meets inpt criteria. Appropriate for 500-hall [facility] bed but none available currently. TTS to seek placement in meantime.  - Counselor informed Dr Bebe ShaggyWickline and [place], RN of disposition. RN agreed to inform pt of plan.   Cyndie MullAnna Dorin [place], [facility]

## 2015-04-30 NOTE — ED Notes (Signed)
Pt talking on the phone. Sitter bedside.

## 2015-04-30 NOTE — ED Notes (Signed)
TTS machine at bedside. 

## 2015-04-30 NOTE — ED Notes (Signed)
[facility] contacted for transport to [facility]

## 2015-04-30 NOTE — ED Notes (Signed)
cbg 278 reported To Dr. Criss AlvineGoldston.

## 2015-04-30 NOTE — ED Notes (Signed)
EDP notified of CBG  

## 2015-05-11 DIAGNOSIS — F22 Delusional disorders: Secondary | ICD-10-CM | POA: Diagnosis not present

## 2015-05-21 DIAGNOSIS — E1140 Type 2 diabetes mellitus with diabetic neuropathy, unspecified: Secondary | ICD-10-CM | POA: Diagnosis not present

## 2015-05-21 DIAGNOSIS — E782 Mixed hyperlipidemia: Secondary | ICD-10-CM | POA: Diagnosis not present

## 2015-05-21 DIAGNOSIS — Z794 Long term (current) use of insulin: Secondary | ICD-10-CM | POA: Diagnosis not present

## 2015-05-21 DIAGNOSIS — E1165 Type 2 diabetes mellitus with hyperglycemia: Secondary | ICD-10-CM | POA: Diagnosis not present

## 2015-05-21 DIAGNOSIS — E7800 Pure hypercholesterolemia, unspecified: Secondary | ICD-10-CM | POA: Diagnosis not present

## 2015-05-21 DIAGNOSIS — E1122 Type 2 diabetes mellitus with diabetic chronic kidney disease: Secondary | ICD-10-CM | POA: Diagnosis not present

## 2015-09-02 IMAGING — US US ABDOMEN COMPLETE
2 series · 13 of 25 positions shown · non-contrast
Comparison: CT ABD W/CM dated 12/01/2005

CLINICAL DATA: Pancreatitis. History of cholecystectomy,
hypertension and diabetes.

EXAM:
ULTRASOUND ABDOMEN COMPLETE

[Series 1: us abdomen complete · 0.27mm/px · 5 of 19 slices shown (1 of 2)]
[im 1/19]
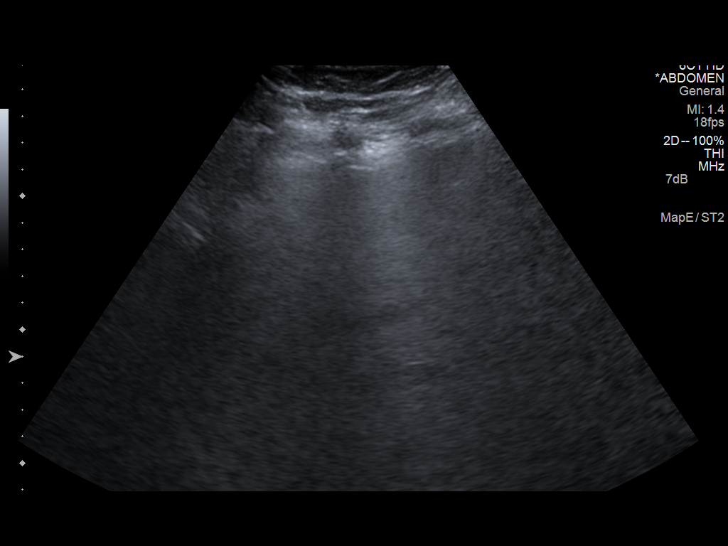
[im 5/19]
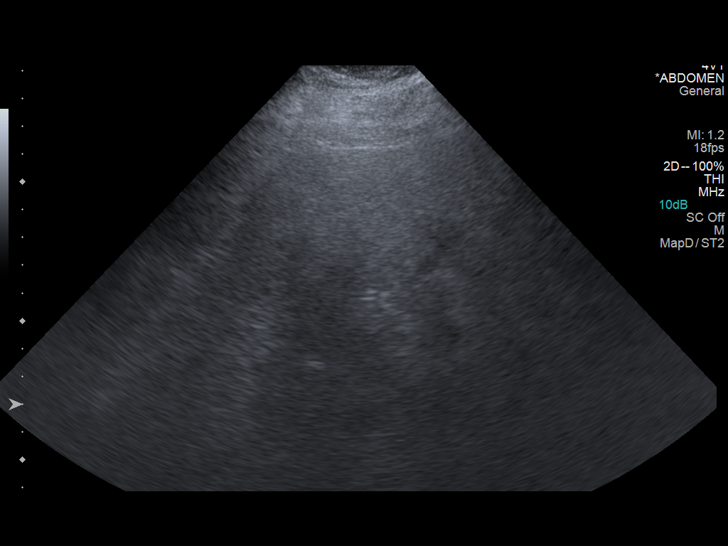
[im 10/19]
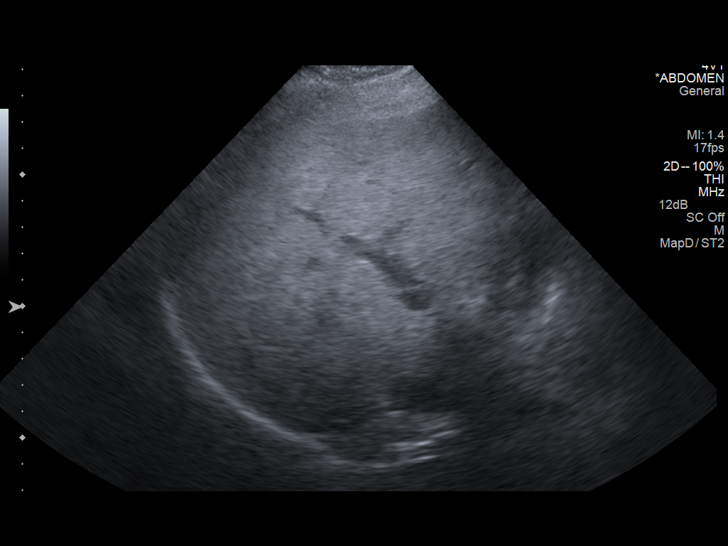
[im 14/19]
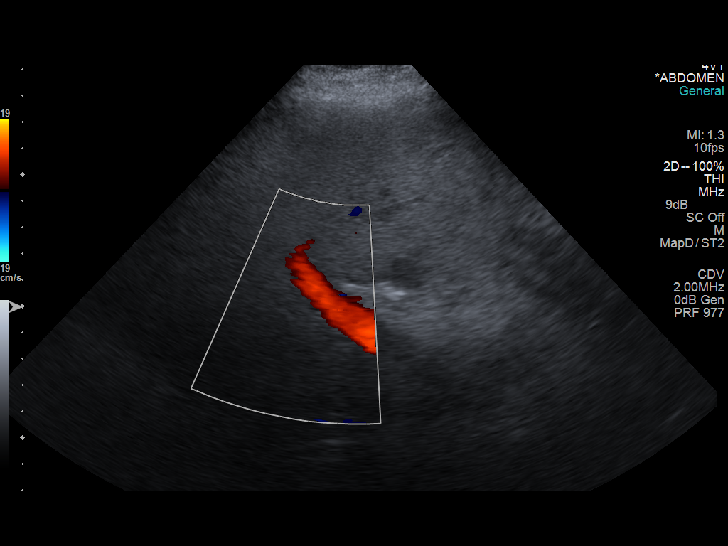
[im 19/19]
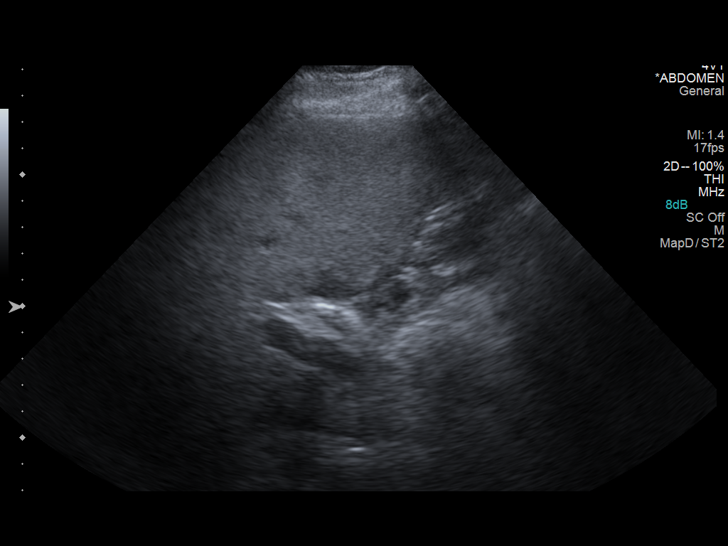

[Series 2001: us abdomen complete · 0.27mm/px · 8 of 34 slices shown (2 of 2)]
[im 3/34]
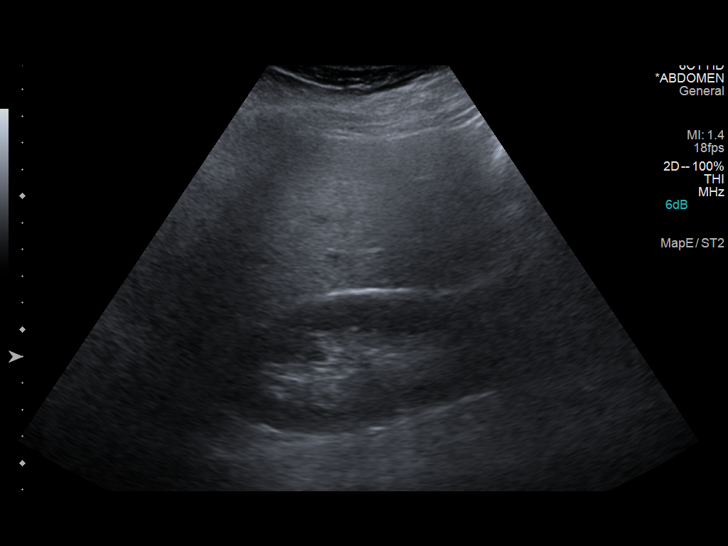
[im 7/34]
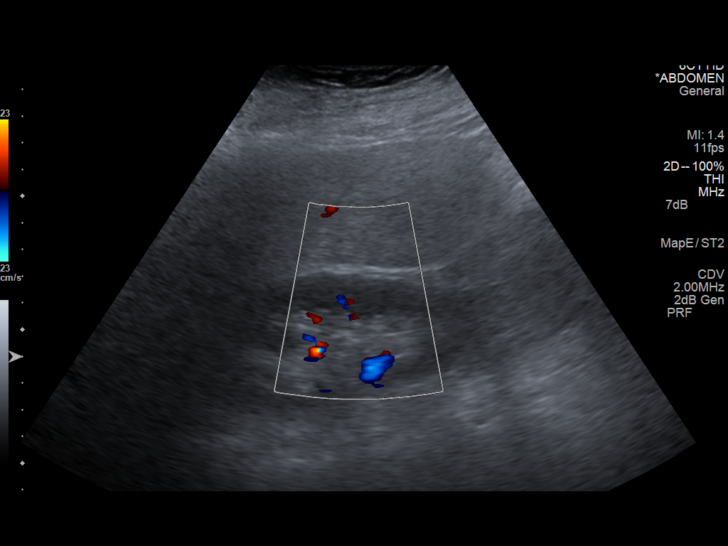
[im 12/34]
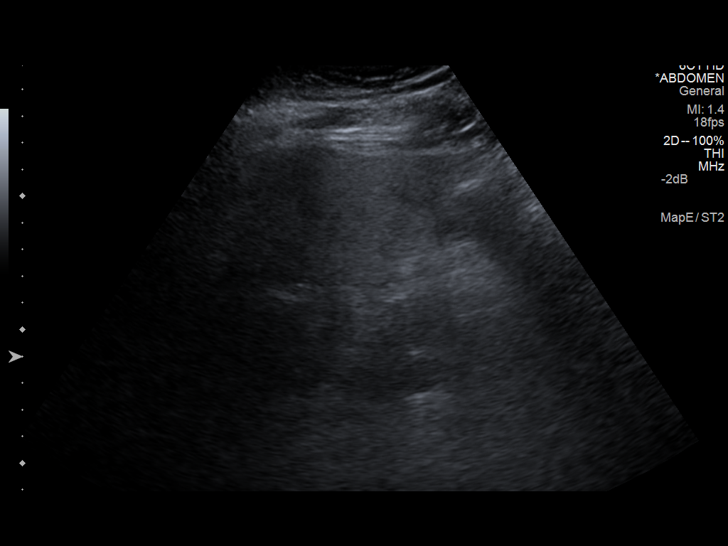
[im 16/34]
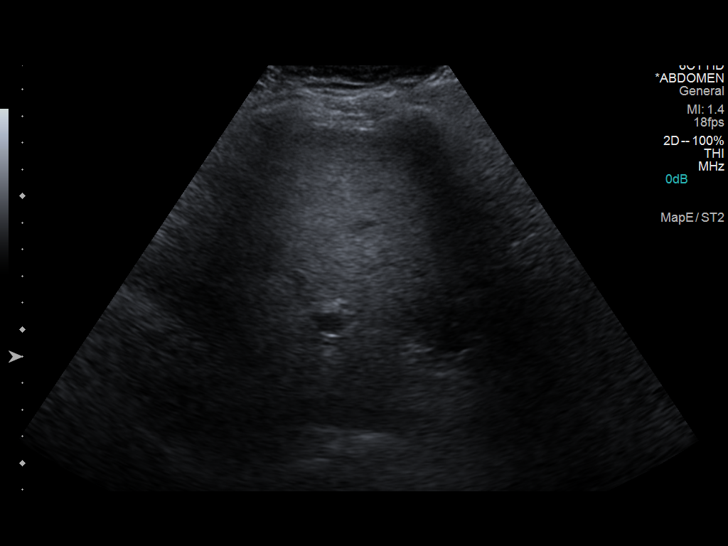
[im 20/34]
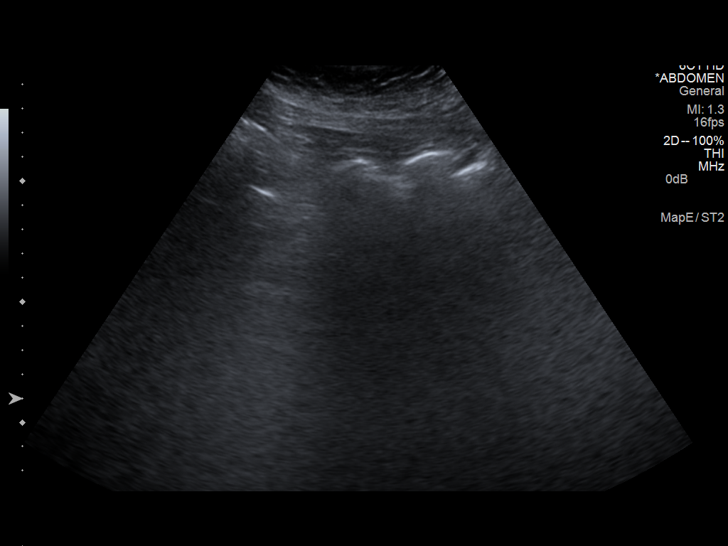
[im 25/34]
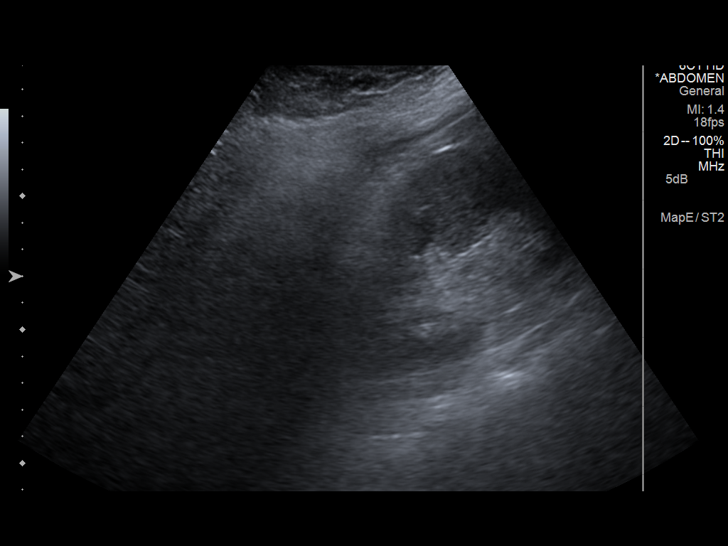
[im 29/34]
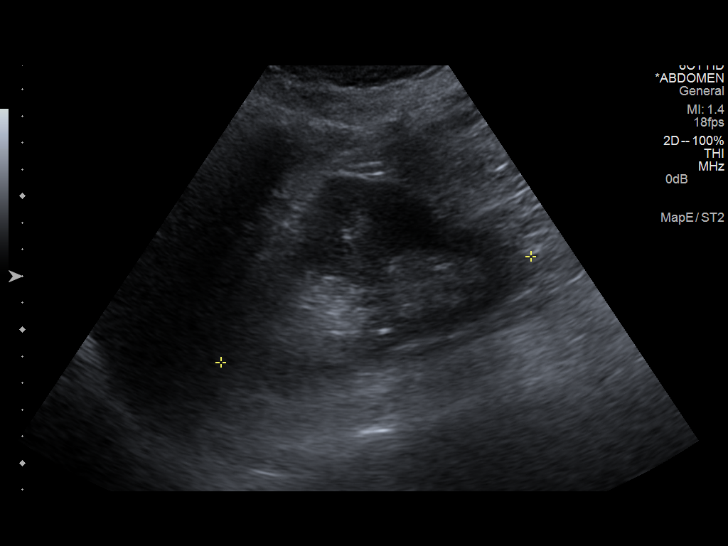
[im 34/34]
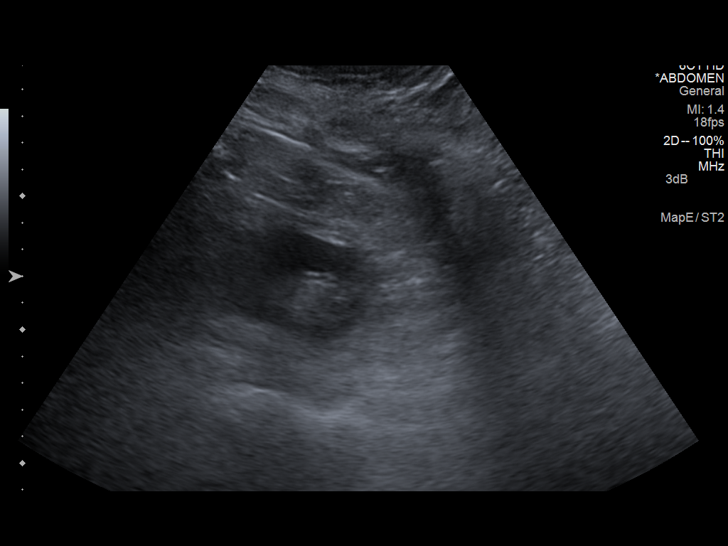

[13 of 25 positions shown; findings below may reference images not displayed]

FINDINGS: Gallbladder:

Surgically absent.

Common bile duct:

Diameter: 3.9 mm.  No evidence of choledocholithiasis.

Liver:

There is diffusely increased hepatic echogenicity with decreased
penetration, consistent with steatosis. No focal lesions are
identified.

IVC:

Largely obscured by bowel gas.  No demonstrated abnormality.

Pancreas:

Largely obscured by bowel gas.  No demonstrated abnormality.

Spleen:

Size and appearance within normal limits.

Right Kidney:

Length: 11.7 cm. Echogenicity within normal limits. No mass or
hydronephrosis visualized.

Left Kidney:

Length: 12.3 cm. Echogenicity within normal limits. No mass or
hydronephrosis visualized.

Abdominal aorta:

Partially obscured by bowel gas.  No aneurysm visualized.

Other findings:

None.
IMPRESSION: 1. Examination is limited by body habitus and bowel gas.
2. Hepatic steatosis. No biliary dilatation following
cholecystectomy.
3. No acute findings demonstrated. Portions of the aorta, IVC and
pancreas are obscured by bowel gas.

## 2015-11-10 ENCOUNTER — Other Ambulatory Visit (HOSPITAL_COMMUNITY): Payer: Self-pay | Admitting: Psychiatry

## 2016-01-05 ENCOUNTER — Other Ambulatory Visit: Payer: Self-pay | Admitting: Internal Medicine

## 2016-01-05 DIAGNOSIS — Z1231 Encounter for screening mammogram for malignant neoplasm of breast: Secondary | ICD-10-CM

## 2016-01-24 ENCOUNTER — Ambulatory Visit
Admission: RE | Admit: 2016-01-24 | Discharge: 2016-01-24 | Disposition: A | Discharge status: Home / Self Care | Payer: Medicare Other | Source: Ambulatory Visit | Attending: Internal Medicine | Admitting: Internal Medicine

## 2016-01-24 DIAGNOSIS — Z1231 Encounter for screening mammogram for malignant neoplasm of breast: Secondary | ICD-10-CM

## 2016-04-19 LAB — TSH: TSH: 2.95 u[IU]/mL (ref 0.41–5.90)

## 2016-04-19 LAB — HEMOGLOBIN A1C: HEMOGLOBIN A1C: 9.4

## 2016-04-19 LAB — LIPID PANEL
LDL CALC: 57 mg/dL
TRIGLYCERIDES: 540 mg/dL — AB (ref 40–160)

## 2016-04-19 LAB — BASIC METABOLIC PANEL: Glucose: 225 mg/dL

## 2016-06-15 ENCOUNTER — Encounter: Payer: Self-pay | Admitting: Endocrinology

## 2016-06-15 ENCOUNTER — Ambulatory Visit (INDEPENDENT_AMBULATORY_CARE_PROVIDER_SITE_OTHER): Payer: Medicare Other | Admitting: Endocrinology

## 2016-06-15 VITALS — BP 138/70 | HR 80 | Ht 64.37 in | Wt 188.6 lb

## 2016-06-15 DIAGNOSIS — E1165 Type 2 diabetes mellitus with hyperglycemia: Secondary | ICD-10-CM | POA: Diagnosis not present

## 2016-06-15 DIAGNOSIS — E782 Mixed hyperlipidemia: Secondary | ICD-10-CM

## 2016-06-15 LAB — GLUCOSE, POCT (MANUAL RESULT ENTRY): POC Glucose: 126 mg/dl — AB (ref 70–99)

## 2016-06-15 MED ORDER — GLUCOSE BLOOD VI STRP: ORAL_STRIP | 12 refills | Status: AC

## 2016-06-15 MED ORDER — LIRAGLUTIDE 18 MG/3ML ~~LOC~~ SOPN: 1.2000 mg | PEN_INJECTOR | Freq: Every day | SUBCUTANEOUS | 3 refills | Status: DC

## 2016-06-15 MED ORDER — OMEGA-3-ACID ETHYL ESTERS 1 G PO CAPS: 4.0000 g | ORAL_CAPSULE | Freq: Every day | ORAL | 3 refills | Status: DC

## 2016-06-15 NOTE — Patient Instructions (Addendum)
Lantus 80 Units  Start VICTOZA injection as shown once daily at the same time of the day.   Dial the dose to 0.6 mg on the pen for the first week.  You may inject in the stomach, thigh or arm. You may experience nausea in the first few days which usually goes away.  You will feel fullness of the stomach with starting the medication and should try to keep the portions at meals small.  After 1 week increase the dose to 1.2mg  daily if no nausea present.    If any questions or concerns are present call the office or the [employer] [facility] helpline at [phone]. Visit [email or web address] for more useful information  Check blood sugars on waking up 2-3x weekly   Also check blood sugars about 2 hours after a meal and do this after different meals by rotation  Recommended blood sugar levels on waking up is 90-130 and about 2 hours after meal is 130-160  Please bring your blood sugar monitor to each visit, thank you  Exercise!!

## 2016-06-15 NOTE — Addendum Note (Signed)
Addended by: Real ConsHALES, Laval Cafaro W on: 06/15/2016 04:39 PM   Modules accepted: Orders

## 2016-06-15 NOTE — Progress Notes (Addendum)
Patient ID: [name], female   DOB: 09/12/1955, 61 y.o.   MRN: [id number]            Reason for Appointment: Consultation for Type 2 Diabetes  Referring physician: Polite   History of Present Illness:          Date of diagnosis of type 2 diabetes mellitus: 2011        Background history:   She was tried on metformin at diagnosis and could not tolerate this because of diarrhea. Subsequently given Amaryl and Januvia Previously level of control and history is unknown She was started on insulin in 07/2013 when her A1c was 12.5 at the time of admission to the hospital for pancreatitis. At that time she was on Januvia and possibly Amaryl Initially she was started on 70/30 insulin but was taking 20 units twice a day but subsequently was started on Lantus insulin.  Recent history:   INSULIN regimen is: Lantus 90 units hs      Non-insulin hypoglycemic drugs the patient is taking are: None  Current management, blood sugar patterns and problems identified:  She has been on a higher dose of Lantus for about 3 months.  Her blood sugars have been persistently poorly controlled in 2017 with A1c ranging from 9.4-10.8  She is very reluctant to check her blood sugars and has not done any monitoring for quite some time  She has however over the last 2 months or so improve her diet significantly with cutting back on carbohydrates, sweets and using more organic type carbohydrates.    She thinks she has lost weight but her weight was 190 with her PCP in November.  She is not motivated to exercise, previously in summer she had been doing some swimming          Side effects from medications have been: Diarrhea from metformin  Compliance with the medical regimen: Fair Hypoglycemia:   never  Glucose monitoring:  not done        Glucometer:  Freestyle  Self-care: The diet that the patient has been following is: tries to limit .     Meal times are:  Breakfast is at 11 AM Lunch: Skipped  Dinner: 8 PM  Typical meal intake: Breakfast is   boiled eggs, [place] sausage, cheese toast, fruit.  Snacks: Cheese crackers, sugar-free ice cream, fruit, brownie              Dietician visit, most recent: 2015               Exercise: none now   Weight history:  Wt Readings from Last 3 Encounters:  06/15/16 188 lb 9.6 oz (85.5 kg)  04/30/15 185 lb (83.9 kg)  04/21/15 185 lb (83.9 kg)    Glycemic control:   Lab Results  Component Value Date   HGBA1C 9.4 04/19/2016   HGBA1C 12.5 (H) 07/15/2013   HGBA1C (H) 09/30/2010    8.2 (NOTE)                                                                       According to the ADA Clinical Practice Recommendations for 2011, when HbA1c is used as a screening test:   >=6.5%   Diagnostic of  Diabetes Mellitus           (if abnormal result  is confirmed)  5.7-6.4%   Increased risk of developing Diabetes Mellitus  References:Diagnosis and Classification of Diabetes Mellitus,Diabetes Care,2011,34(Suppl 1):S62-S69 and Standards of Medical Care in         Diabetes - 2011,Diabetes Care,2011,34  (Suppl 1):S11-S61.   Lab Results  Component Value Date   LDLCALC 57 04/19/2016   CREATININE 0.88 04/30/2015   No results found for: MICRALBCREAT     Allergies as of 06/15/2016      Reactions   Januvia [sitagliptin] Other (See Comments)   Hx of pancreatitis   Metformin And Related Diarrhea      Medication List       Accurate as of 06/15/16  2:21 PM. Always use your most recent med list.          acetaminophen 500 MG tablet Commonly known as:  TYLENOL Take 1,000 mg by mouth every 6 (six) hours as needed (pain).   aspirin EC 81 MG tablet Take 81 mg by mouth 2 (two) times daily.   buPROPion 150 MG 24 hr tablet Commonly known as:  WELLBUTRIN XL Take 150 mg by mouth daily.   cetirizine 10 MG tablet Commonly known as:  ZYRTEC Take 1 tablet (10 mg total) by mouth daily.   LANTUS SOLOSTAR 100 UNIT/ML Solostar Pen Generic drug:  Insulin  Glargine Inject 80 Units into the skin at bedtime.   lisinopril 20 MG tablet Commonly known as:  PRINIVIL,ZESTRIL Take 1 tablet (20 mg total) by mouth daily.   omeprazole 20 MG tablet Commonly known as:  PRILOSEC OTC Take 1 tablet (20 mg total) by mouth daily.   sertraline 100 MG tablet Commonly known as:  ZOLOFT   simvastatin 20 MG tablet Commonly known as:  ZOCOR Take 1 tablet (20 mg total) by mouth daily.       Allergies:  Allergies  Allergen Reactions  . Januvia [Sitagliptin] Other (See Comments)    Hx of pancreatitis  . Metformin And Related Diarrhea    Past Medical History:  Diagnosis Date  . Anemia   . Anxiety   . Arthritis    right little finger  . Complication of anesthesia    10/2014-facial swelling after surgery-noticed in recovery room  . Depression    hx of suicide attempts in past (none for several years as of 10/12/14 - per pt)  . Diabetes mellitus without complication (HCC)   . GERD (gastroesophageal reflux disease)   . High cholesterol   . Hypertension   . Pancreatitis    2 yrs ago- no problem now.    Past Surgical History:  Procedure Laterality Date  . ABDOMINAL HYSTERECTOMY    . BACK SURGERY     lumbar surgery  . BALLOON DILATION N/A 12/23/2014   Procedure: BALLOON DILATION;  Surgeon: Willis Modena, MD;  Location: [facility];  Service: Endoscopy;  Laterality: N/A;  . BALLOON DILATION N/A 02/17/2015   Procedure: BALLOON DILATION;  Surgeon: Willis Modena, MD;  Location: [facility];  Service: Endoscopy;  Laterality: N/A;  . BALLOON DILATION N/A 04/07/2015   Procedure: BALLOON DILATION;  Surgeon: Willis Modena, MD;  Location: [facility];  Service: Endoscopy;  Laterality: N/A;  . BALLOON DILATION N/A 04/21/2015   Procedure: BALLOON DILATION;  Surgeon: Willis Modena, MD;  Location: [facility];  Service: Endoscopy;  Laterality: N/A;  . CHOLECYSTECTOMY     laparoscopic  . COLONOSCOPY    . COLONOSCOPY WITH PROPOFOL  N/A 12/23/2014    Procedure: COLONOSCOPY WITH PROPOFOL;  Surgeon: Willis ModenaWilliam Outlaw, MD;  Location: [facility];  Service: Endoscopy;  Laterality: N/A;  . ESOPHAGOGASTRODUODENOSCOPY (EGD) WITH PROPOFOL N/A 12/23/2014   Procedure: ESOPHAGOGASTRODUODENOSCOPY (EGD) WITH PROPOFOL;  Surgeon: Willis ModenaWilliam Outlaw, MD;  Location: [facility];  Service: Endoscopy;  Laterality: N/A;  . ESOPHAGOGASTRODUODENOSCOPY (EGD) WITH PROPOFOL N/A 02/17/2015   Procedure: ESOPHAGOGASTRODUODENOSCOPY (EGD) WITH PROPOFOL;  Surgeon: Willis ModenaWilliam Outlaw, MD;  Location: [facility];  Service: Endoscopy;  Laterality: N/A;  with balloon dilation  . ESOPHAGOGASTRODUODENOSCOPY (EGD) WITH PROPOFOL N/A 04/07/2015   Procedure: ESOPHAGOGASTRODUODENOSCOPY (EGD) WITH PROPOFOL;  Surgeon: Willis ModenaWilliam Outlaw, MD;  Location: [facility];  Service: Endoscopy;  Laterality: N/A;  . ESOPHAGOGASTRODUODENOSCOPY (EGD) WITH PROPOFOL N/A 04/21/2015   Procedure: ESOPHAGOGASTRODUODENOSCOPY (EGD) WITH PROPOFOL;  Surgeon: Willis ModenaWilliam Outlaw, MD;  Location: [facility];  Service: Endoscopy;  Laterality: N/A;  . SHOULDER SURGERY Left    repair tendon tear  . TONSILLECTOMY      Family History  Problem Relation Age of Onset  . Alzheimer's disease Mother   . Cancer - Lung Father     Social History:  reports that she has never smoked. She has never used smokeless tobacco. She reports that she does not drink alcohol or use drugs.   Review of Systems  Constitutional: Positive for weight loss. Negative for reduced appetite and malaise.  HENT: Positive for trouble swallowing.        She has difficulty swallowing large tablets, no recurrence of dysphagia for food  Eyes: Negative for visual disturbance.  Respiratory: Negative for shortness of breath.   Cardiovascular: Negative for palpitations and leg swelling.       Will get some pain in the right calf on walking at times  Gastrointestinal: Positive for diarrhea.       Mild diarrhea, she thinks this is from Zoloft  Endocrine: Negative  for fatigue, general weakness and polydipsia.  Genitourinary: Negative for frequency.  Musculoskeletal: Positive for joint pain.       She has had some sciatica pain  Skin: Negative for rash.  Neurological: Positive for tingling.  Psychiatric/Behavioral: Positive for depressed mood.       Controlled with Zoloft    Lipid history:     Lab Results  Component Value Date   CHOL 754 (H) 07/16/2013   HDL NOT REPORTED DUE TO HIGH TRIGLYCERIDES 07/16/2013   LDLCALC 57 04/19/2016   TRIG 540 (A) 04/19/2016   CHOLHDL NOT REPORTED DUE TO HIGH TRIGLYCERIDES 07/16/2013           Hypertension: Has been treated with lisinopril  Most recent eye exam was 4/17  Most recent foot exam:    LABS:  Office Visit on 06/15/2016  Component Date Value Ref Range Status  . POC Glucose 06/15/2016 126* 70 - 99 mg/dl Final  . Glucose 04/54/098111/01/2016 225  mg/dL Final  . Triglycerides 04/19/2016 540* 40 - 160 mg/dL Final  . LDL Cholesterol 04/19/2016 57  mg/dL Final  . Hemoglobin [id number] 04/19/2016 9.4   Final  . TSH 04/19/2016 2.95  0.41 - 5.90 uIU/mL Final    Physical Examination:  BP 138/70   Pulse 80   Ht 5' 4.37" (1.635 m)   Wt 188 lb 9.6 oz (85.5 kg)   SpO2 98%   BMI 32.00 kg/m   GENERAL:         Patient has generalized obesity.   HEENT:         Eye exam shows normal external appearance. Fundus  exam shows no retinopathy.  Oral exam shows normal mucosa .  NECK:   There is no lymphadenopathy Thyroid is not enlarged and no nodules felt.  Carotids are normal to palpation and no bruit heard LUNGS:         Chest is symmetrical. Lungs are clear to auscultation.Marland Kitchen   HEART:         Heart sounds:  S1 and S2 are normal. No murmur or click heard., no S3 or S4.   ABDOMEN:   There is no distention present. Liver and spleen are not palpable.  No other mass or tenderness present.   NEUROLOGICAL:   Ankle jerks are absent bilaterally.    Diabetic Foot Exam - Simple   Simple Foot Form Diabetic Foot exam was  performed with the following findings:  Yes 06/15/2016 12:00 PM  Visual Inspection No deformities, no ulcerations, no other skin breakdown bilaterally:  Yes Sensation Testing See comments:  Yes Pulse Check See comments:  Yes Comments Absent monofilament sensation in distal feet. Pulses Absent on the Left Foot            Vibration sense is  Moderately reduced in distal first toes. MUSCULOSKELETAL:  There is no swelling or deformity of the peripheral joints.  Spine is normal to inspection.   EXTREMITIES:     There is no edema. No skin lesions present.Marland Kitchen SKIN:       No rash or lesions of concern.        ASSESSMENT:  Diabetes type 2, uncontrolled     His A1c has been 9-10% in 2017 but not clear what her level of control was previously She has generally poor motivation to take care of her diabetes until recently when she has started to improve her diet Not clear if she is insulin deficient she was not given a trial of a GLP-1 drug or SGLT 2 drugs before going on insulin; however she did have marked hyperglycemia in early 2015 when A1c was 12.5 indicating some relative insulin deficiency. 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  Esophageal stricture   PLAN:   Discussed with the patient the nature of GLP-1 drugs, the actions on various organ systems, how they benefit blood glucose control, as well as the benefit of weight loss and  increase satiety . Explained possible side effects especially nausea and vomiting initially; discussed safety information in package insert.  Described the injection technique and dosage titration of Victoza  starting with 0.6 mg once a day at the same time for the first week and then increasing to 1.2 mg if no symptoms of nausea.  Educational brochure on Victoza  given  She will start  walking or other exercise, she plans to join a gym.  She will restart home glucose monitoring at least once a day at various times as discussed   Discussed blood sugar targets  With starting Victoza she can probably reduce her Lantus by 10 units in for convenience can take 8 units in a single dose daily  Needs assessment of urine microalbumin  Start LOVAZA 2 g twice a day for high triglycerides along with continuing her simvastatin  Continue to follow-up with PCP for hypertension  Patient Instructions  Lantus 80 Units  Start VICTOZA injection as shown once daily at the same time of the day.   Dial the dose to 0.6 mg on the pen for the first week.  You may inject in the stomach, thigh or arm. You may experience nausea in the first few days which usually goes away.  You will feel fullness of the stomach with starting the medication and should try to keep the portions at meals small.  After 1 week increase the dose to 1.2mg  daily if no nausea present.    If any questions or concerns are present call the office or the Victoza [facility] helpline at [phone]. Visit [email or web address] for more useful information  Check blood sugars on waking up 2-3x weekly   Also check  blood sugars about 2 hours after a meal and do this after different meals by rotation  Recommended blood sugar levels on waking up is 90-130 and about 2 hours after meal is 130-160  Please bring your blood sugar monitor to each visit, thank you  Exercise!!    Counseling time on subjects discussed above is over 50% of today's 60 minute visit  Consultation note has been sent to the referring physician  [facility] 06/15/2016, 2:21 PM   Note: This office note was prepared with Dragon voice recognition system technology. Any transcriptional errors that result from this process are unintentional.

## 2016-06-19 ENCOUNTER — Telehealth: Payer: Self-pay | Admitting: Endocrinology

## 2016-06-19 NOTE — Telephone Encounter (Signed)
Pt called in and said that due to her GERD she is struggling to take the fish oil caplet and she also stated that the fenofibrate is too big to swallow as well. She would like to know if there are any suggestions as to what she needs to do about this issue.

## 2016-06-20 NOTE — Telephone Encounter (Signed)
She can ask pharmacist if she can crush the fenofibrate and take it with applesauce

## 2016-06-22 NOTE — Telephone Encounter (Signed)
Called an left voice mail with instructions and asked for a call back if she had any questions

## 2016-07-03 NOTE — Telephone Encounter (Signed)
She needs to check her blood sugar at least once daily as directed at various times.  She needs to call us back with her blood sugar.  Also confirm that she is taking 1.2 mg Victoza. She can reduce her insulin by 5 units for now Also for a couple of days can reduce her Victoza back to 0.6 mg

## 2016-07-03 NOTE — Telephone Encounter (Signed)
Tried calling and leaving a message but the machine hung up the call I will try again tomorrow

## 2016-07-03 NOTE — Telephone Encounter (Signed)
Patient is feeling shaky, nauseated, this morning she is taking victoza and 80 units lantus at bedtime for a week and a half she has not checked her b/s the las week and a half.  Please advise

## 2016-07-05 NOTE — Telephone Encounter (Signed)
Called again and still no anwer

## 2016-07-06 NOTE — Telephone Encounter (Signed)
Called and no answer again today- I have unable to reach this patient

## 2016-07-10 ENCOUNTER — Other Ambulatory Visit (INDEPENDENT_AMBULATORY_CARE_PROVIDER_SITE_OTHER): Payer: Medicare Other

## 2016-07-10 DIAGNOSIS — E782 Mixed hyperlipidemia: Secondary | ICD-10-CM | POA: Diagnosis not present

## 2016-07-10 DIAGNOSIS — E1165 Type 2 diabetes mellitus with hyperglycemia: Secondary | ICD-10-CM | POA: Diagnosis not present

## 2016-07-10 LAB — LIPID PANEL
CHOL/HDL RATIO: 5
Cholesterol: 155 mg/dL (ref 0–200)
HDL: 33.1 mg/dL — ABNORMAL LOW (ref >39.00)
NonHDL: 122.16
Triglycerides: 275 mg/dL — ABNORMAL HIGH (ref 0.0–149.0)
VLDL: 55 mg/dL — ABNORMAL HIGH (ref 0.0–40.0)

## 2016-07-10 LAB — BASIC METABOLIC PANEL
BUN: 17 mg/dL (ref 6–23)
CHLORIDE: 104 meq/L (ref 96–112)
CO2: 25 mEq/L (ref 19–32)
Calcium: 9.5 mg/dL (ref 8.4–10.5)
Creatinine, Ser: 1.16 mg/dL (ref 0.40–1.20)
GFR: 50.37 mL/min — ABNORMAL LOW (ref >60.00)
GLUCOSE: 88 mg/dL (ref 70–99)
Potassium: 4.2 mEq/L (ref 3.5–5.1)
Sodium: 138 mEq/L (ref 135–145)

## 2016-07-10 LAB — URINALYSIS, ROUTINE W REFLEX MICROSCOPIC
Bilirubin Urine: NEGATIVE
Ketones, ur: NEGATIVE
Nitrite: NEGATIVE
Specific Gravity, Urine: 1.02 (ref 1.000–1.030)
Total Protein, Urine: NEGATIVE
URINE GLUCOSE: NEGATIVE
UROBILINOGEN UA: 0.2 (ref 0.0–1.0)
pH: 5.5 (ref 5.0–8.0)

## 2016-07-10 LAB — MICROALBUMIN / CREATININE URINE RATIO
Creatinine,U: 164.8 mg/dL
MICROALB UR: 4.1 mg/dL — AB (ref 0.0–1.9)
MICROALB/CREAT RATIO: 2.5 mg/g (ref 0.0–30.0)

## 2016-07-10 LAB — LDL CHOLESTEROL, DIRECT: LDL DIRECT: 45 mg/dL

## 2016-07-11 LAB — FRUCTOSAMINE: Fructosamine: 262 umol/L (ref 0–285)

## 2016-07-14 ENCOUNTER — Encounter: Payer: Self-pay | Admitting: Endocrinology

## 2016-07-14 ENCOUNTER — Ambulatory Visit (INDEPENDENT_AMBULATORY_CARE_PROVIDER_SITE_OTHER): Payer: Medicare Other | Admitting: Endocrinology

## 2016-07-14 VITALS — BP 100/66 | HR 112 | Ht 64.0 in | Wt 182.0 lb

## 2016-07-14 DIAGNOSIS — Z794 Long term (current) use of insulin: Secondary | ICD-10-CM

## 2016-07-14 DIAGNOSIS — E1165 Type 2 diabetes mellitus with hyperglycemia: Secondary | ICD-10-CM

## 2016-07-14 NOTE — Patient Instructions (Addendum)
Check blood sugars on waking up  2x weekly  Also check blood sugars about 2 hours after a meal and do this after different meals by rotation  Recommended blood sugar levels on waking up is 90-130 and about 2 hours after meal is 130-160  Please bring your blood sugar monitor to each visit, thank you  Change LANTUS to am and stay on 70  Exercise

## 2016-07-14 NOTE — Progress Notes (Signed)
Patient ID: [name], female   DOB: 02-Mar-1956, 61 y.o.   MRN: [id number]            Reason for Appointment: Follow-up for Type 2 Diabetes  Referring physician: Polite   History of Present Illness:          Date of diagnosis of type 2 diabetes mellitus: 2011        Background history:   She was tried on metformin at diagnosis and could not tolerate this because of diarrhea. Subsequently given Amaryl and Januvia Previously level of control and history is unknown She was started on insulin in 07/2013 when her A1c was 12.5 at the time of admission to the hospital for pancreatitis. At that time she was on Januvia and possibly Amaryl Initially she was started on 70/30 insulin but was taking 20 units twice a day but subsequently was started on Lantus insulin.  Recent history:   INSULIN regimen is: Lantus 70 units hs      Her blood sugars had been persistently poorly controlled in 2017 with A1c ranging from 9.4-10.8 Now  fructosamine indicates improved control  Non-insulin hypoglycemic drugs the patient is taking are: Victoza 1.2 mg daily  Current management, blood sugar patterns and problems identified:  She has been started on Victoza in 1/18 because of her poor control and difficulty losing weight  Although she has had nausea with this this is now getting significantly better and she is able to tolerate this  She is also able to cut back on her portions significantly  She had called about nonspecific malaise but we were unable to contact her; she thinks she had an episode of passing out but her sugar after juice was well over 200 and did get evaluated by PCP  On her own she has cut back on her Lantus and is taking only 70 units for the last week  Her lab glucose was 88 fasting but she has not done any fasting readings at home  She has LOST 6 pounds  She also things that she is generally trying to cut back on high-fat and high carbohydrate meals  She is not motivated  to exercise again and even though she was reminded to do so       Side effects from medications have been: Diarrhea from metformin  Compliance with the medical regimen: Fair Hypoglycemia:   never  Glucose monitoring:  not done        Glucometer:   Mean values apply above for all meters except median for One Touch  PRE-MEAL Fasting Lunch Dinner Late p.m.  Overall  Glucose range: ?   153  169  130-237    Mean/median:     175     Self-care: The diet that the patient has been following is: tries to limit .     Meal times are:  Breakfast is at 11 AM Lunch: Skipped Dinner: 8 PM  Typical meal intake: Breakfast is   boiled eggs, [place] sausage, cheese toast, fruit.  Snacks: Cheese crackers, sugar-free ice cream, fruit, brownie              Dietician visit, most recent: 2015               Exercise: none now   Weight history:  Wt Readings from Last 3 Encounters:  07/14/16 182 lb (82.6 kg)  06/15/16 188 lb 9.6 oz (85.5 kg)  04/30/15 185 lb (83.9 kg)    Glycemic control:  Lab Results  Component Value Date   HGBA1C 9.4 04/19/2016   HGBA1C 12.5 (H) 07/15/2013   HGBA1C (H) 09/30/2010    8.2 (NOTE)                                                                       According to the ADA Clinical Practice Recommendations for 2011, when HbA1c is used as a screening test:   >=6.5%   Diagnostic of Diabetes Mellitus           (if abnormal result  is confirmed)  5.7-6.4%   Increased risk of developing Diabetes Mellitus  References:Diagnosis and Classification of Diabetes Mellitus,Diabetes Care,2011,34(Suppl 1):S62-S69 and Standards of Medical Care in         Diabetes - 2011,Diabetes Care,2011,34  (Suppl 1):S11-S61.   Lab Results  Component Value Date   MICROALBUR 4.1 (H) 07/10/2016   LDLCALC 57 04/19/2016   CREATININE 1.16 07/10/2016   Lab Results  Component Value Date   MICRALBCREAT 2.5 07/10/2016    Lab Results  Component Value Date   FRUCTOSAMINE 262 07/10/2016       Allergies as of 07/14/2016      Reactions   Januvia [sitagliptin] Other (See Comments)   Hx of pancreatitis   Metformin And Related Diarrhea      Medication List       Accurate as of 07/14/16 12:07 PM. Always use your most recent med list.          acetaminophen 500 MG tablet Commonly known as:  TYLENOL Take 1,000 mg by mouth every 6 (six) hours as needed (pain).   aspirin EC 81 MG tablet Take 81 mg by mouth 2 (two) times daily.   buPROPion 150 MG 24 hr tablet Commonly known as:  WELLBUTRIN XL Take 150 mg by mouth daily.   cetirizine 10 MG tablet Commonly known as:  ZYRTEC Take 1 tablet (10 mg total) by mouth daily.   glucose blood test strip Use as instructed   LANTUS SOLOSTAR 100 UNIT/ML Solostar Pen Generic drug:  Insulin Glargine Inject 80 Units into the skin at bedtime.   liraglutide 18 MG/3ML Sopn Commonly known as:  VICTOZA Inject 0.2 mLs (1.2 mg total) into the skin daily.   lisinopril 20 MG tablet Commonly known as:  PRINIVIL,ZESTRIL Take 1 tablet (20 mg total) by mouth daily.   omega-3 acid ethyl esters 1 g capsule Commonly known as:  LOVAZA Take 4 capsules (4 g total) by mouth daily.   omeprazole 20 MG tablet Commonly known as:  PRILOSEC OTC Take 1 tablet (20 mg total) by mouth daily.   sertraline 100 MG tablet Commonly known as:  ZOLOFT   simvastatin 20 MG tablet Commonly known as:  ZOCOR Take 1 tablet (20 mg total) by mouth daily.       Allergies:  Allergies  Allergen Reactions  . Januvia [Sitagliptin] Other (See Comments)    Hx of pancreatitis  . Metformin And Related Diarrhea    Past Medical History:  Diagnosis Date  . Anemia   . Anxiety   . Arthritis    right little finger  . Complication of anesthesia    10/2014-facial swelling after surgery-noticed in recovery room  . Depression  hx of suicide attempts in past (none for several years as of 10/12/14 - per pt)  . Diabetes mellitus without complication (HCC)   .  GERD (gastroesophageal reflux disease)   . High cholesterol   . Hypertension   . Pancreatitis    2 yrs ago- no problem now.    Past Surgical History:  Procedure Laterality Date  . ABDOMINAL HYSTERECTOMY    . BACK SURGERY     lumbar surgery  . BALLOON DILATION N/A 12/23/2014   Procedure: BALLOON DILATION;  Surgeon: Willis ModenaWilliam Outlaw, MD;  Location: [facility];  Service: Endoscopy;  Laterality: N/A;  . BALLOON DILATION N/A 02/17/2015   Procedure: BALLOON DILATION;  Surgeon: Willis ModenaWilliam Outlaw, MD;  Location: [facility];  Service: Endoscopy;  Laterality: N/A;  . BALLOON DILATION N/A 04/07/2015   Procedure: BALLOON DILATION;  Surgeon: Willis ModenaWilliam Outlaw, MD;  Location: [facility];  Service: Endoscopy;  Laterality: N/A;  . BALLOON DILATION N/A 04/21/2015   Procedure: BALLOON DILATION;  Surgeon: Willis ModenaWilliam Outlaw, MD;  Location: [facility];  Service: Endoscopy;  Laterality: N/A;  . CHOLECYSTECTOMY     laparoscopic  . COLONOSCOPY    . COLONOSCOPY WITH PROPOFOL N/A 12/23/2014   Procedure: COLONOSCOPY WITH PROPOFOL;  Surgeon: Willis ModenaWilliam Outlaw, MD;  Location: [facility];  Service: Endoscopy;  Laterality: N/A;  . ESOPHAGOGASTRODUODENOSCOPY (EGD) WITH PROPOFOL N/A 12/23/2014   Procedure: ESOPHAGOGASTRODUODENOSCOPY (EGD) WITH PROPOFOL;  Surgeon: Willis ModenaWilliam Outlaw, MD;  Location: [facility];  Service: Endoscopy;  Laterality: N/A;  . ESOPHAGOGASTRODUODENOSCOPY (EGD) WITH PROPOFOL N/A 02/17/2015   Procedure: ESOPHAGOGASTRODUODENOSCOPY (EGD) WITH PROPOFOL;  Surgeon: Willis ModenaWilliam Outlaw, MD;  Location: [facility];  Service: Endoscopy;  Laterality: N/A;  with balloon dilation  . ESOPHAGOGASTRODUODENOSCOPY (EGD) WITH PROPOFOL N/A 04/07/2015   Procedure: ESOPHAGOGASTRODUODENOSCOPY (EGD) WITH PROPOFOL;  Surgeon: Willis ModenaWilliam Outlaw, MD;  Location: [facility];  Service: Endoscopy;  Laterality: N/A;  . ESOPHAGOGASTRODUODENOSCOPY (EGD) WITH PROPOFOL N/A 04/21/2015   Procedure: ESOPHAGOGASTRODUODENOSCOPY (EGD) WITH PROPOFOL;   Surgeon: Willis ModenaWilliam Outlaw, MD;  Location: [facility];  Service: Endoscopy;  Laterality: N/A;  . SHOULDER SURGERY Left    repair tendon tear  . TONSILLECTOMY      Family History  Problem Relation Age of Onset  . Alzheimer's disease Mother   . Cancer - Lung Father     Social History:  reports that she has never smoked. She has never used smokeless tobacco. She reports that she does not drink alcohol or use drugs.   Review of Systems  Lipid history: could not do Lovaza; she cannot swallow fenofibrate tablets    Lab Results  Component Value Date   CHOL 155 07/10/2016   HDL 33.10 (L) 07/10/2016   LDLCALC 57 04/19/2016   LDLDIRECT 45.0 07/10/2016   TRIG 275.0 (H) 07/10/2016   CHOLHDL 5 07/10/2016           Hypertension: Has been treated with lisinopril  Most recent eye exam was 4/17  Most recent foot exam:    LABS:  Lab on 07/10/2016  Component Date Value Ref Range Status  . Sodium 07/10/2016 138  135 - 145 mEq/L Final  . Potassium 07/10/2016 4.2  3.5 - 5.1 mEq/L Final  . Chloride 07/10/2016 104  96 - 112 mEq/L Final  . CO2 07/10/2016 25  19 - 32 mEq/L Final  . Glucose, Bld 07/10/2016 88  70 - 99 mg/dL Final  . BUN 40/98/119101/29/2018 17  6 - 23 mg/dL Final  . Creatinine, Ser 07/10/2016 1.16  0.40 - 1.20 mg/dL Final  .  Calcium 07/10/2016 9.5  8.4 - 10.5 mg/dL Final  . GFR 16/03/9603 50.37* >60.00 mL/min Final  . Fructosamine 07/10/2016 262  0 - 285 umol/L Final   Comment: Published reference interval for apparently healthy subjects between age 51 and 68 is 101 - 285 umol/L and in a poorly controlled diabetic population is 228 - 563 umol/L with a mean of 396 umol/L.   Marland Kitchen Cholesterol 07/10/2016 155  0 - 200 mg/dL Final  . Triglycerides 07/10/2016 275.0* 0.0 - 149.0 mg/dL Final  . HDL 54/02/8118 33.10* >39.00 mg/dL Final  . VLDL 14/78/2956 55.0* 0.0 - 40.0 mg/dL Final  . Total CHOL/HDL Ratio 07/10/2016 5   Final  . NonHDL 07/10/2016 122.16   Final  . Color, Urine  07/10/2016 YELLOW  Yellow;Lt. Yellow Final  . APPearance 07/10/2016 CLEAR  Clear Final  . Specific Gravity, Urine 07/10/2016 1.020  1.000 - 1.030 Final  . pH 07/10/2016 5.5  5.0 - 8.0 Final  . Total Protein, Urine 07/10/2016 NEGATIVE  Negative Final  . Urine Glucose 07/10/2016 NEGATIVE  Negative Final  . Ketones, ur 07/10/2016 NEGATIVE  Negative Final  . Bilirubin Urine 07/10/2016 NEGATIVE  Negative Final  . Hgb urine dipstick 07/10/2016 TRACE-INTACT* Negative Final  . Urobilinogen, UA 07/10/2016 0.2  0.0 - 1.0 Final  . Leukocytes, UA 07/10/2016 SMALL* Negative Final  . Nitrite 07/10/2016 NEGATIVE  Negative Final  . WBC, UA 07/10/2016 3-6/hpf* 0-2/hpf Final  . RBC / HPF 07/10/2016 0-2/hpf  0-2/hpf Final  . Mucus, UA 07/10/2016 Presence of* None Final  . Squamous Epithelial / LPF 07/10/2016 Rare(0-4/hpf)  Rare(0-4/hpf) Final  . Microalb, Ur 07/10/2016 4.1* 0.0 - 1.9 mg/dL Final  . Creatinine,U 21/30/8657 164.8  mg/dL Final  . Microalb Creat Ratio 07/10/2016 2.5  0.0 - 30.0 mg/g Final  . Direct LDL 07/10/2016 45.0  mg/dL Final    Physical Examination:  BP 100/66   Pulse (!) 112   Ht 5\' 4"  (1.626 m)   Wt 182 lb (82.6 kg)   SpO2 97%   BMI 31.24 kg/m     ASSESSMENT:  Diabetes type 2, uncontrolled    See history of present illness for detailed discussion of current diabetes management, blood sugar patterns and problems identified  Her blood sugars are appearing improved with adding Victoza to her regimen However even though she has lost weight she is still appears to have high readings after meals She is checking sporadically and mostly after supper some variable results Also not exercising  Fasting sugar in the lab was in the 80s and this may indicate that Lantus may not be working 24 hours  Complications of diabetes: Peripheral neuropathy with sensory loss, symptomatically feels better  HYPERLIPIDEMIA: She has had improvement in triglycerides even without using the patient  on May improve further with continued improvement in her weight, diabetes control and diet   PLAN:     Try to change her Lantus to the morning  Discussed that if she continues to have high readings after meals she may need mealtime insulin  Also if fasting readings start going up may need to consider twice a day Lantus or change to [employer] checking blood sugars more consistently fasting and after breakfast and lunch  No change in Victoza  Will need A1c on the next visit  She needs to start exercising and she thinks she can join a Pool to start swimming  Continue to follow-up with PCP for hypertension  Patient Instructions  Check blood sugars  on waking up  2x weekly  Also check blood sugars about 2 hours after a meal and do this after different meals by rotation  Recommended blood sugar levels on waking up is 90-130 and about 2 hours after meal is 130-160  Please bring your blood sugar monitor to each visit, thank you  Change LANTUS to am and stay on 70  Exercise  Counseling time on subjects discussed above is over 50% of today's 25 minute visit  Consultation note has been sent to the referring physician  [facility] 07/14/2016, 12:07 PM   Note: This office note was prepared with Dragon voice recognition system technology. Any transcriptional errors that result from this process are unintentional.

## 2016-09-12 ENCOUNTER — Other Ambulatory Visit (INDEPENDENT_AMBULATORY_CARE_PROVIDER_SITE_OTHER): Payer: Medicare Other

## 2016-09-12 DIAGNOSIS — E1165 Type 2 diabetes mellitus with hyperglycemia: Secondary | ICD-10-CM

## 2016-09-12 DIAGNOSIS — Z794 Long term (current) use of insulin: Secondary | ICD-10-CM

## 2016-09-12 LAB — LDL CHOLESTEROL, DIRECT: Direct LDL: 41 mg/dL

## 2016-09-12 LAB — BASIC METABOLIC PANEL
BUN: 18 mg/dL (ref 6–23)
CALCIUM: 8.9 mg/dL (ref 8.4–10.5)
CHLORIDE: 105 meq/L (ref 96–112)
CO2: 24 mEq/L (ref 19–32)
CREATININE: 0.95 mg/dL (ref 0.40–1.20)
GFR: 63.39 mL/min (ref >60.00)
Glucose, Bld: 115 mg/dL — ABNORMAL HIGH (ref 70–99)
Potassium: 4 mEq/L (ref 3.5–5.1)
SODIUM: 136 meq/L (ref 135–145)

## 2016-09-12 LAB — LIPID PANEL
CHOL/HDL RATIO: 5
CHOLESTEROL: 122 mg/dL (ref 0–200)
HDL: 24.3 mg/dL — ABNORMAL LOW (ref >39.00)
NonHDL: 98.15
TRIGLYCERIDES: 359 mg/dL — AB (ref 0.0–149.0)
VLDL: 71.8 mg/dL — ABNORMAL HIGH (ref 0.0–40.0)

## 2016-09-12 LAB — HEMOGLOBIN A1C: HEMOGLOBIN A1C: 7.6 % — AB (ref 4.6–6.5)

## 2016-09-14 NOTE — Progress Notes (Signed)
Patient ID: [name], female   DOB: 02/24/1956, 61 y.o.   MRN: [id number]            Reason for Appointment: Follow-up for Type 2 Diabetes  Referring physician: Polite   History of Present Illness:          Date of diagnosis of type 2 diabetes mellitus: 2011        Background history:   She was tried on metformin at diagnosis and could not tolerate this because of diarrhea. Subsequently given Amaryl and Januvia Previously level of control and history is unknown She was started on insulin in 07/2013 when her A1c was 12.5 at the time of admission to the hospital for pancreatitis. At that time she was on Januvia and possibly Amaryl Initially she was started on 70/30 insulin but was taking 20 units twice a day but subsequently was started on Lantus insulin.  Recent history:   INSULIN regimen is: Lantus 70 units am      Her blood sugars had been persistently poorly controlled in 2017 with A1c ranging from 9.4-10.8  A1c is now down to 7.6  Non-insulin hypoglycemic drugs the patient is taking are: Victoza 1.2 mg daily  Current management, blood sugar patterns and problems identified:  She has benefited from using Victoza which she is tolerating and sugars are appearing better  However she has only 3 blood sugars in the last month  She was told to move her Lantus to morning to help with daytime hyperglycemia  However not clear if she has just recently good fasting readings as she was having high readings last month at home, lab glucose is 115 fasting  She has only one reading after her evening meal at night  She has not had any meal planning instructions from the dietitian in the couple of years at least  She is trying to do a little walking but no formal exercise  Has not lost any further weight        Side effects from medications have been: Diarrhea from metformin  Compliance with the medical regimen: Fair Hypoglycemia:   never  Glucose monitoring:  not done         Glucometer: Accucheck  Mean values apply above for all meters except median for One Touch  PRE-MEAL Fasting Lunch Dinner Bedtime Overall  Glucose range: 133, 155  171   111    Mean/median:         Self-care: The diet that the patient has been following is: tries to limit .     Meal times are:  Breakfast is at 11 AM Lunch: Skipped Dinner: 8 PM  Typical meal intake: Breakfast is   boiled eggs, [place] sausage, cheese toast, fruit.  Snacks: Cheese crackers, sugar-free ice cream, fruit, brownie              Dietician visit, most recent: 2015               Exercise: now   Weight history:  Wt Readings from Last 3 Encounters:  09/15/16 182 lb (82.6 kg)  07/14/16 182 lb (82.6 kg)  06/15/16 188 lb 9.6 oz (85.5 kg)    Glycemic control:    Lab Results  Component Value Date   HGBA1C 7.6 (H) 09/12/2016   HGBA1C 9.4 04/19/2016   HGBA1C 12.5 (H) 07/15/2013   Lab Results  Component Value Date   MICROALBUR 4.1 (H) 07/10/2016   LDLCALC 57 04/19/2016   CREATININE 0.95 09/12/2016  Lab Results  Component Value Date   MICRALBCREAT 2.5 07/10/2016    Lab Results  Component Value Date   FRUCTOSAMINE 262 07/10/2016      Allergies as of 09/15/2016      Reactions   Januvia [sitagliptin] Other (See Comments)   Hx of pancreatitis   Metformin And Related Diarrhea      Medication List       Accurate as of 09/15/16  8:36 AM. Always use your most recent med list.          acetaminophen 500 MG tablet Commonly known as:  TYLENOL Take 1,000 mg by mouth every 6 (six) hours as needed (pain).   aspirin EC 81 MG tablet Take 81 mg by mouth. Take 2 tablets 2 times daily   buPROPion 150 MG 24 hr tablet Commonly known as:  WELLBUTRIN XL Take 150 mg by mouth daily. 2 tablets daily   cetirizine 10 MG tablet Commonly known as:  ZYRTEC Take 1 tablet (10 mg total) by mouth daily.   glucose blood test strip Use as instructed   LANTUS SOLOSTAR 100 UNIT/ML Solostar Pen Generic drug:   Insulin Glargine Inject 70 Units into the skin at bedtime.   liraglutide 18 MG/3ML Sopn Commonly known as:  VICTOZA Inject 0.2 mLs (1.2 mg total) into the skin daily.   lisinopril 20 MG tablet Commonly known as:  PRINIVIL,ZESTRIL Take 1 tablet (20 mg total) by mouth daily.   omega-3 acid ethyl esters 1 g capsule Commonly known as:  LOVAZA Take 4 capsules (4 g total) by mouth daily.   omeprazole 20 MG tablet Commonly known as:  PRILOSEC OTC Take 1 tablet (20 mg total) by mouth daily.   sertraline 100 MG tablet Commonly known as:  ZOLOFT 100 mg. Take tablet at night   simvastatin 20 MG tablet Commonly known as:  ZOCOR Take 1 tablet (20 mg total) by mouth daily.       Allergies:  Allergies  Allergen Reactions  . Januvia [Sitagliptin] Other (See Comments)    Hx of pancreatitis  . Metformin And Related Diarrhea    Past Medical History:  Diagnosis Date  . Anemia   . Anxiety   . Arthritis    right little finger  . Complication of anesthesia    10/2014-facial swelling after surgery-noticed in recovery room  . Depression    hx of suicide attempts in past (none for several years as of 10/12/14 - per pt)  . Diabetes mellitus without complication (HCC)   . GERD (gastroesophageal reflux disease)   . High cholesterol   . Hypertension   . Pancreatitis    2 yrs ago- no problem now.    Past Surgical History:  Procedure Laterality Date  . ABDOMINAL HYSTERECTOMY    . BACK SURGERY     lumbar surgery  . BALLOON DILATION N/A 12/23/2014   Procedure: BALLOON DILATION;  Surgeon: Willis Modena, MD;  Location: [facility];  Service: Endoscopy;  Laterality: N/A;  . BALLOON DILATION N/A 02/17/2015   Procedure: BALLOON DILATION;  Surgeon: Willis Modena, MD;  Location: [facility];  Service: Endoscopy;  Laterality: N/A;  . BALLOON DILATION N/A 04/07/2015   Procedure: BALLOON DILATION;  Surgeon: Willis Modena, MD;  Location: [facility];  Service: Endoscopy;  Laterality: N/A;  .  BALLOON DILATION N/A 04/21/2015   Procedure: BALLOON DILATION;  Surgeon: Willis Modena, MD;  Location: [facility];  Service: Endoscopy;  Laterality: N/A;  . CHOLECYSTECTOMY     laparoscopic  .  COLONOSCOPY    . COLONOSCOPY WITH PROPOFOL N/A 12/23/2014   Procedure: COLONOSCOPY WITH PROPOFOL;  Surgeon: Willis Modena, MD;  Location: [facility];  Service: Endoscopy;  Laterality: N/A;  . ESOPHAGOGASTRODUODENOSCOPY (EGD) WITH PROPOFOL N/A 12/23/2014   Procedure: ESOPHAGOGASTRODUODENOSCOPY (EGD) WITH PROPOFOL;  Surgeon: Willis Modena, MD;  Location: [facility];  Service: Endoscopy;  Laterality: N/A;  . ESOPHAGOGASTRODUODENOSCOPY (EGD) WITH PROPOFOL N/A 02/17/2015   Procedure: ESOPHAGOGASTRODUODENOSCOPY (EGD) WITH PROPOFOL;  Surgeon: Willis Modena, MD;  Location: [facility];  Service: Endoscopy;  Laterality: N/A;  with balloon dilation  . ESOPHAGOGASTRODUODENOSCOPY (EGD) WITH PROPOFOL N/A 04/07/2015   Procedure: ESOPHAGOGASTRODUODENOSCOPY (EGD) WITH PROPOFOL;  Surgeon: Willis Modena, MD;  Location: [facility];  Service: Endoscopy;  Laterality: N/A;  . ESOPHAGOGASTRODUODENOSCOPY (EGD) WITH PROPOFOL N/A 04/21/2015   Procedure: ESOPHAGOGASTRODUODENOSCOPY (EGD) WITH PROPOFOL;  Surgeon: Willis Modena, MD;  Location: [facility];  Service: Endoscopy;  Laterality: N/A;  . SHOULDER SURGERY Left    repair tendon tear  . TONSILLECTOMY      Family History  Problem Relation Age of Onset  . Alzheimer's disease Mother   . Cancer - Lung Father     Social History:  reports that she has never smoked. She has never used smokeless tobacco. She reports that she does not drink alcohol or use drugs.   Review of Systems  Lipid history: could not do Lovaza; she cannot swallow fenofibrate tablets    Lab Results  Component Value Date   CHOL 122 09/12/2016   HDL 24.30 (L) 09/12/2016   LDLCALC 57 04/19/2016   LDLDIRECT 41.0 09/12/2016   TRIG 359.0 (H) 09/12/2016   CHOLHDL 5 09/12/2016             Hypertension: Has been treated with lisinopril  Most recent eye exam was 4/17  Most recent foot exam:1/18  Complications of diabetes: Peripheral neuropathy with sensory loss, symptomatically feels better   LABS:  Lab on 09/12/2016  Component Date Value Ref Range Status  . Hgb A1c MFr Bld 09/12/2016 7.6* 4.6 - 6.5 % Final  . Sodium 09/12/2016 136  135 - 145 mEq/L Final  . Potassium 09/12/2016 4.0  3.5 - 5.1 mEq/L Final  . Chloride 09/12/2016 105  96 - 112 mEq/L Final  . CO2 09/12/2016 24  19 - 32 mEq/L Final  . Glucose, Bld 09/12/2016 115* 70 - 99 mg/dL Final  . BUN 16/03/9603 18  6 - 23 mg/dL Final  . Creatinine, Ser 09/12/2016 0.95  0.40 - 1.20 mg/dL Final  . Calcium 54/02/8118 8.9  8.4 - 10.5 mg/dL Final  . GFR 14/78/2956 63.39  >60.00 mL/min Final  . Cholesterol 09/12/2016 122  0 - 200 mg/dL Final  . Triglycerides 09/12/2016 359.0* 0.0 - 149.0 mg/dL Final  . HDL 21/30/8657 24.30* >39.00 mg/dL Final  . VLDL 84/69/6295 71.8* 0.0 - 40.0 mg/dL Final  . Total CHOL/HDL Ratio 09/12/2016 5   Final  . NonHDL 09/12/2016 98.15   Final  . Direct LDL 09/12/2016 41.0  mg/dL Final    Physical Examination:  BP 116/68   Pulse (!) 112   Ht  (1.626 m)   Wt 182 lb (82.6 kg)   BMI 31.24 kg/m     ASSESSMENT:  Diabetes type 2, uncontrolled    See history of present illness for detailed discussion of current diabetes management, blood sugar patterns and problems identified  A1c 7.6 which is improved, she has been on Victoza since January Her main difficulty is not checking blood sugars enough  and not clear if she has consistently good readings both fasting and postprandial, has only one reading at home at night and fairly good fasting readings in the lab She does need more diabetes education especially meal planning instruction Encouraged her to start exercise  HYPERLIPIDEMIA: She has triglycerides over 300 Unable to swallow fish oil or fenofibrate tablets reportedly because of  dysphagia  PLAN:     Try to check blood sugars more consistently at home at various times and discussed blood sugar targets  No change in treatment regimen  Consultation with dietitian to help with meal planning for diabetes and hyperlipidemia  She can try to join the [facility] for water exercises  Patient Instructions  Check blood sugars on waking up  3x weekly  Also check blood sugars about 2 hours after a meal and do this after different meals by rotation  Recommended blood sugar levels on waking up is 90-130 and about 2 hours after meal is 130-160  Please bring your blood sugar monitor to each visit, thank you  Restart exercise    [facility] 09/15/2016, 8:36 AM   Note: This office note was prepared with Dragon voice recognition system technology. Any transcriptional errors that result from this process are unintentional.

## 2016-09-15 ENCOUNTER — Ambulatory Visit (INDEPENDENT_AMBULATORY_CARE_PROVIDER_SITE_OTHER): Payer: Medicare Other | Admitting: Endocrinology

## 2016-09-15 ENCOUNTER — Encounter: Payer: Self-pay | Admitting: Endocrinology

## 2016-09-15 VITALS — BP 116/68 | HR 112 | Ht 64.0 in | Wt 182.0 lb

## 2016-09-15 DIAGNOSIS — E1165 Type 2 diabetes mellitus with hyperglycemia: Secondary | ICD-10-CM | POA: Diagnosis not present

## 2016-09-15 DIAGNOSIS — Z794 Long term (current) use of insulin: Secondary | ICD-10-CM

## 2016-09-15 NOTE — Patient Instructions (Addendum)
Check blood sugars on waking up  3x weekly  Also check blood sugars about 2 hours after a meal and do this after different meals by rotation  Recommended blood sugar levels on waking up is 90-130 and about 2 hours after meal is 130-160  Please bring your blood sugar monitor to each visit, thank you  Restart exercise

## 2016-10-14 ENCOUNTER — Other Ambulatory Visit: Payer: Self-pay | Admitting: Endocrinology

## 2016-10-14 DIAGNOSIS — E1165 Type 2 diabetes mellitus with hyperglycemia: Secondary | ICD-10-CM

## 2016-11-12 IMAGING — RF DG ESOPHAGUS
10 of 12 series · 19 of 24 positions shown · non-contrast
Comparison: None.

CLINICAL DATA: Dysphagia

EXAM:
ESOPHOGRAM / BARIUM SWALLOW / BARIUM TABLET STUDY
TECHNIQUE: Combined double contrast and single contrast examination performed
using effervescent crystals, thick barium liquid, and thin barium
liquid. The patient was observed with fluoroscopy swallowing a 13mm
barium sulphate tablet.
FLUOROSCOPY TIME:  Radiation Exposure Index (as provided by the
fluoroscopic device):
If the device does not provide the exposure index:
Fluoroscopy Time:  1 minutes 48 seconds
Number of Acquired Images:  Normal esophageal motility.

[Series 1: run · 10 of 18 slices shown (1 of 10)]
[im 1/18]
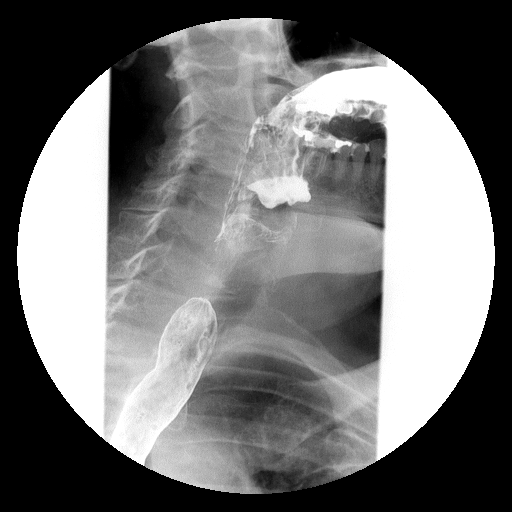
[im 2/18]
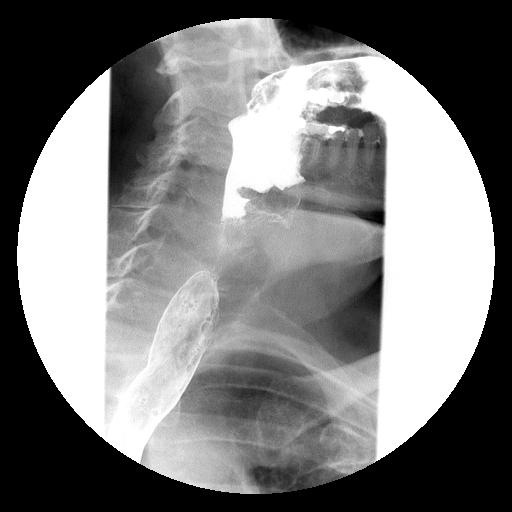
[im 5/18]
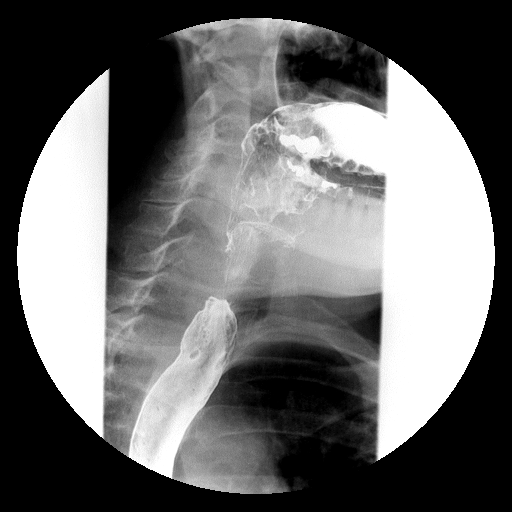
[im 6/18]
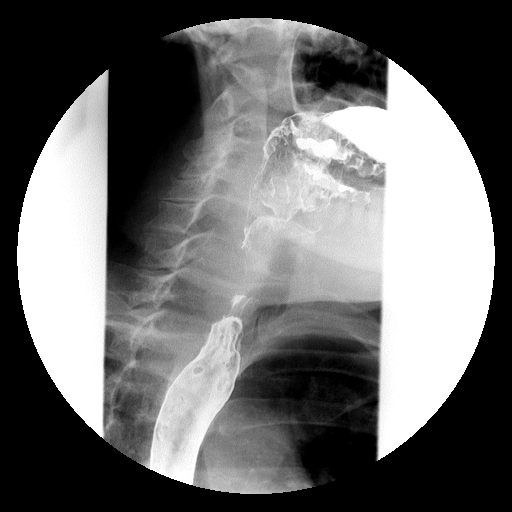
[im 8/18]
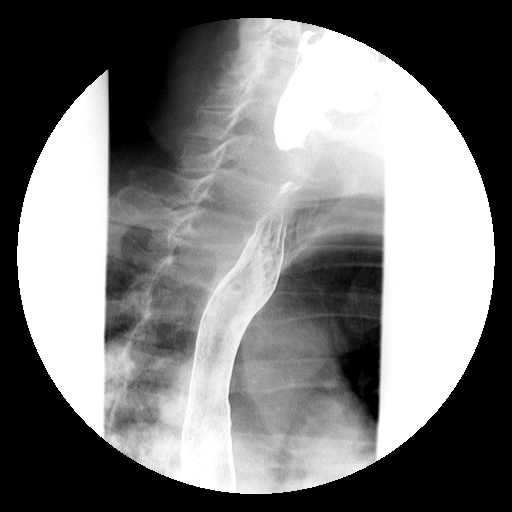
[im 9/18]
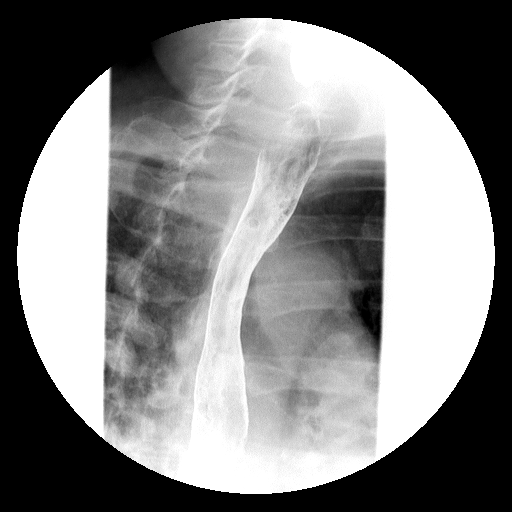
[im 12/18]
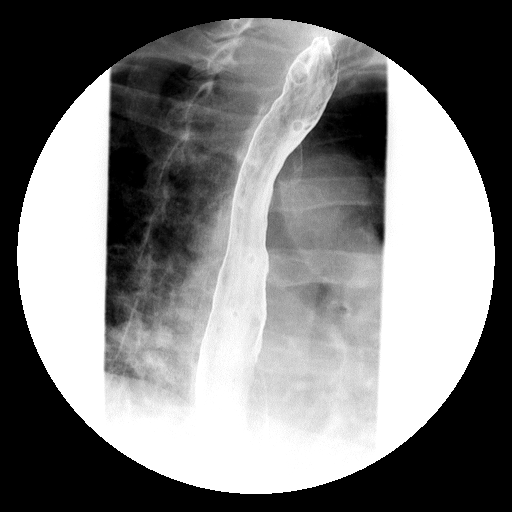
[im 13/18]
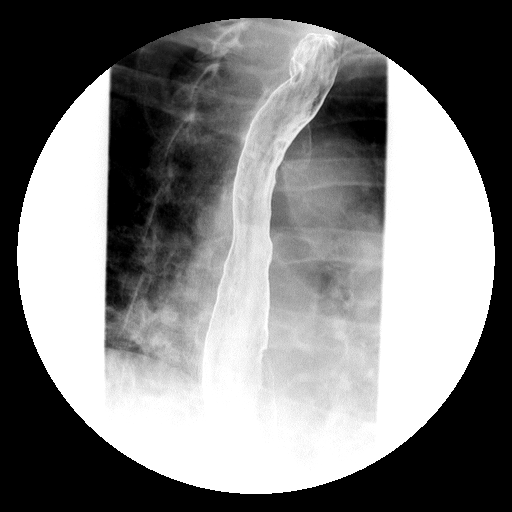
[im 15/18]
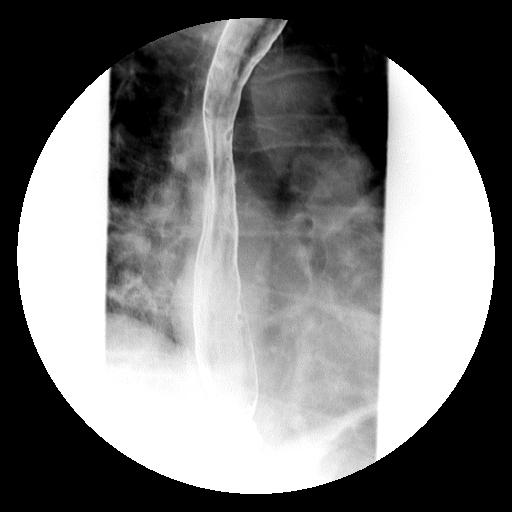
[im 18/18]
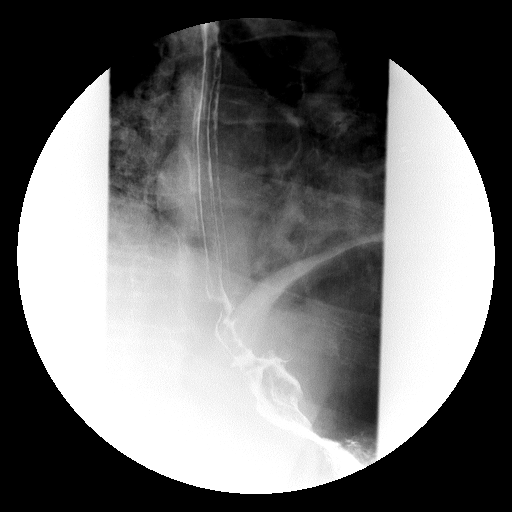

[Series 2: run · 1 of 1 slices shown (2 of 10)]
[im 1/1]
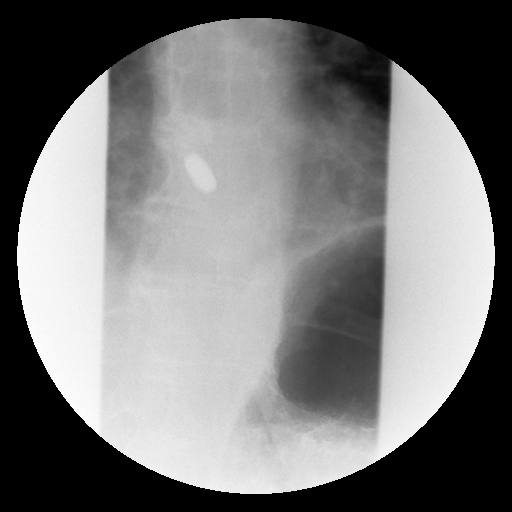

[Series 3: run · 1 of 1 slices shown (3 of 10)]
[im 1/1]
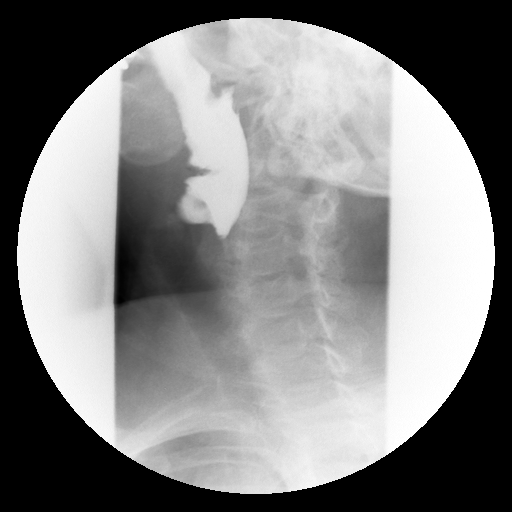

[Series 4: run · 1 of 1 slices shown (4 of 10)]
[im 1/1]
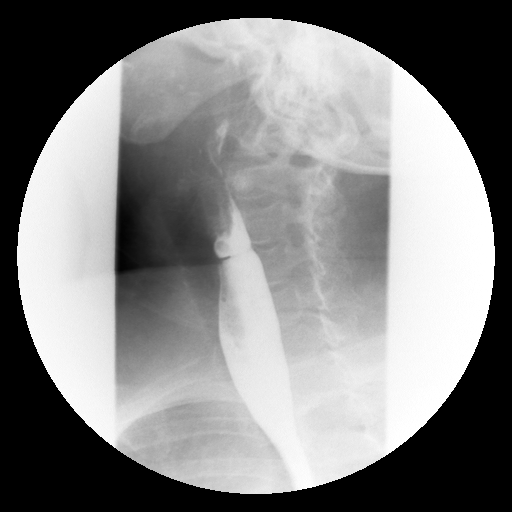

[Series 6: run · 1 of 1 slices shown (5 of 10)]
[im 1/1]
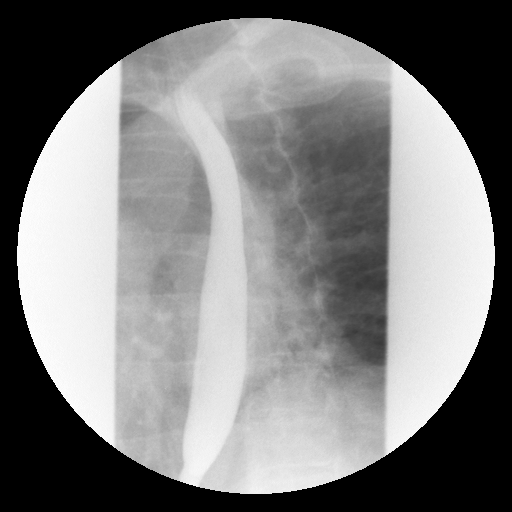

[Series 7: run · 1 of 1 slices shown (6 of 10)]
[im 1/1]
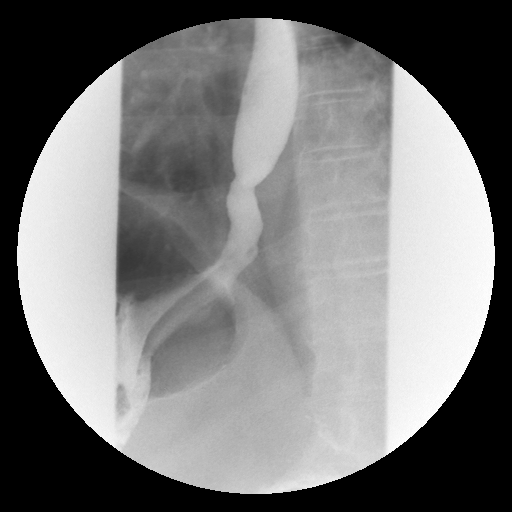

[Series 8: run · 1 of 1 slices shown (7 of 10)]
[im 1/1]
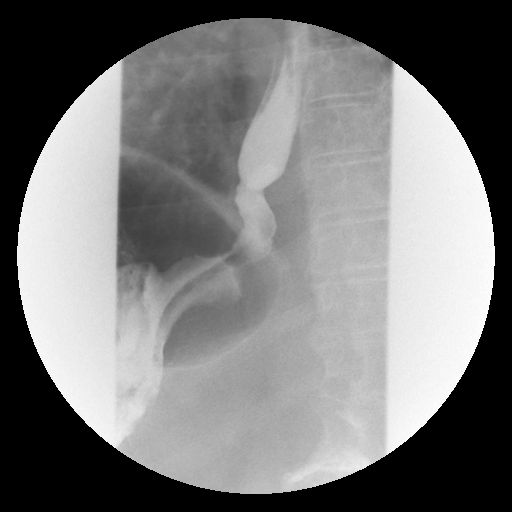

[Series 9: run · 1 of 1 slices shown (8 of 10)]
[im 1/1]
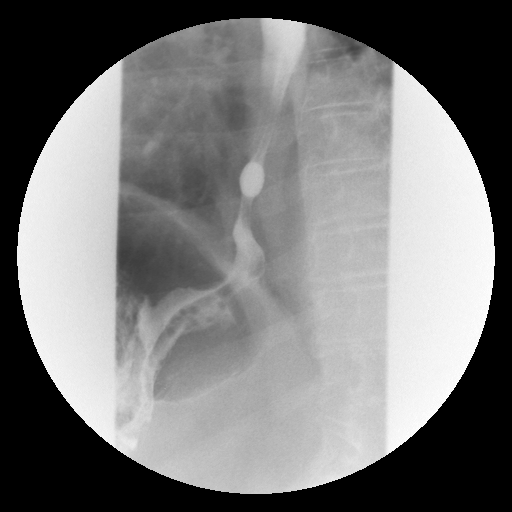

[Series 11: run · 1 of 1 slices shown (9 of 10)]
[im 1/1]
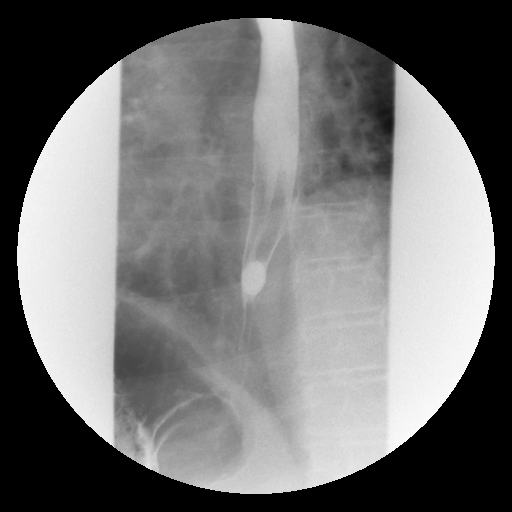

[Series 12: run · 1 of 1 slices shown (10 of 10)]
[im 1/1]
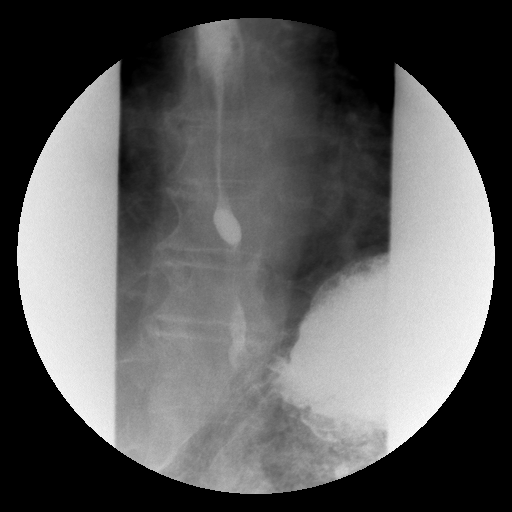

[19 of 24 positions shown; findings below may reference images not displayed]

FINDINGS: Normal esophageal motility. Mild focal web-like stricture in the
proximal esophagus at the C4-5 level. Barium tablet passed this area
without delay.

Small hiatal hernia without reflux. Smooth mild stricture distal
esophagus. Barium tablet did not pass this area for approximately 5
minutes of intermittent fluoroscopy. Patient drank multiple sips of
water during this period.
IMPRESSION: Mild focal stricture proximal esophagus

Hiatal hernia and stricture distal esophagus. Barium tablet did not
pass the distal stricture.

## 2016-12-15 ENCOUNTER — Other Ambulatory Visit: Payer: Self-pay | Admitting: Endocrinology

## 2016-12-18 ENCOUNTER — Other Ambulatory Visit (INDEPENDENT_AMBULATORY_CARE_PROVIDER_SITE_OTHER): Payer: Medicare Other

## 2016-12-18 DIAGNOSIS — Z794 Long term (current) use of insulin: Secondary | ICD-10-CM

## 2016-12-18 DIAGNOSIS — E1165 Type 2 diabetes mellitus with hyperglycemia: Secondary | ICD-10-CM

## 2016-12-18 LAB — LIPID PANEL
CHOL/HDL RATIO: 5
CHOLESTEROL: 168 mg/dL (ref 0–200)
HDL: 36.9 mg/dL — ABNORMAL LOW (ref >39.00)
NonHDL: 130.89
Triglycerides: 313 mg/dL — ABNORMAL HIGH (ref 0.0–149.0)
VLDL: 62.6 mg/dL — ABNORMAL HIGH (ref 0.0–40.0)

## 2016-12-18 LAB — COMPREHENSIVE METABOLIC PANEL
ALBUMIN: 3.9 g/dL (ref 3.5–5.2)
ALT: 16 U/L (ref 0–35)
AST: 18 U/L (ref 0–37)
Alkaline Phosphatase: 99 U/L (ref 39–117)
BUN: 22 mg/dL (ref 6–23)
CHLORIDE: 104 meq/L (ref 96–112)
CO2: 25 mEq/L (ref 19–32)
CREATININE: 1.09 mg/dL (ref 0.40–1.20)
Calcium: 9.8 mg/dL (ref 8.4–10.5)
GFR: 54.05 mL/min — ABNORMAL LOW (ref >60.00)
Glucose, Bld: 130 mg/dL — ABNORMAL HIGH (ref 70–99)
Potassium: 4.1 mEq/L (ref 3.5–5.1)
SODIUM: 136 meq/L (ref 135–145)
TOTAL PROTEIN: 7.3 g/dL (ref 6.0–8.3)
Total Bilirubin: 0.5 mg/dL (ref 0.2–1.2)

## 2016-12-18 LAB — LDL CHOLESTEROL, DIRECT: Direct LDL: 57 mg/dL

## 2016-12-18 LAB — HEMOGLOBIN A1C: HEMOGLOBIN A1C: 8 % — AB (ref 4.6–6.5)

## 2016-12-20 NOTE — Progress Notes (Signed)
Patient ID: [name], female   DOB: 1956/02/28, 60 y.o.   MRN: [id number]            Reason for Appointment: Follow-up for Type 2 Diabetes  Referring physician: Polite   History of Present Illness:          Date of diagnosis of type 2 diabetes mellitus: 2011        Background history:   She was tried on metformin at diagnosis and could not tolerate this because of diarrhea. Subsequently given Amaryl and Januvia Previously level of control and history is unknown She was started on insulin in 07/2013 when her A1c was 12.5 at the time of admission to the hospital for pancreatitis. At that time she was on Januvia and possibly Amaryl Initially she was started on 70/30 insulin but was taking 20 units twice a day but subsequently was started on Lantus insulin.  Recent history:   INSULIN regimen is: Lantus 70 units am      Her blood sugars had been persistently poorly controlled in 2017 with A1c ranging from 9.4-10.8   A1c is now 8%, previously was improving down to 7.6  Non-insulin hypoglycemic drugs the patient is taking are: Victoza 1.2 mg daily  Current management, blood sugar patterns and problems identified:  She has worsening of her diabetes with A1c going up again instead of improving further  Currently taking only 1.2 mg Victoza.  She has not checked her sugar in the last month  She said that she has had a lot of stress at home  Also may not be planning her meals well  She does take her insulin and Victoza in evening regularly, has not changed the regimen to the morning as suggested   She has not been doing any walking for exercise, she thinks she is just active during adherence  She was recommended consultation with the dietitian but did not schedule his       Side effects from medications have been: Diarrhea from metformin  Compliance with the medical regimen: Fair Hypoglycemia:   never  Glucose monitoring:  not done        Glucometer:  Accucheck  Self-care: The diet that the patient has been following is: tries to limit .     Meal times are:  Breakfast is at 11 AM Lunch: Skipped Dinner: 8 PM  Typical meal intake: Breakfast is   boiled eggs, [place] sausage, cheese toast, fruit.  Snacks: Cheese crackers, sugar-free ice cream, fruit, brownie              Dietician visit, most recent: 2015               Exercise:  no formal exercise  Weight history:  Wt Readings from Last 3 Encounters:  12/21/16 181 lb 6.4 oz (82.3 kg)  09/15/16 182 lb (82.6 kg)  07/14/16 182 lb (82.6 kg)    Glycemic control:    Lab Results  Component Value Date   HGBA1C 8.0 (H) 12/18/2016   HGBA1C 7.6 (H) 09/12/2016   HGBA1C 9.4 04/19/2016   Lab Results  Component Value Date   MICROALBUR 4.1 (H) 07/10/2016   LDLCALC 57 04/19/2016   CREATININE 1.09 12/18/2016   Lab Results  Component Value Date   MICRALBCREAT 2.5 07/10/2016    Lab Results  Component Value Date   FRUCTOSAMINE 262 07/10/2016      Allergies as of 12/21/2016      Reactions   Januvia [sitagliptin] Other (See  Comments)   Hx of pancreatitis   Metformin And Related Diarrhea      Medication List       Accurate as of 12/21/16  8:57 AM. Always use your most recent med list.          acetaminophen 500 MG tablet Commonly known as:  TYLENOL Take 1,000 mg by mouth every 6 (six) hours as needed (pain).   aspirin EC 81 MG tablet Take 81 mg by mouth. Take 2 tablets 2 times daily   cetirizine 10 MG tablet Commonly known as:  ZYRTEC Take 1 tablet (10 mg total) by mouth daily.   glucose blood test strip Use as instructed   LANTUS SOLOSTAR 100 UNIT/ML Solostar Pen Generic drug:  Insulin Glargine INJECT 90 UNITS UNDER THE SKIN EVERY EVENING   liraglutide 18 MG/3ML Sopn Commonly known as:  VICTOZA Inject 0.3 mLs (1.8 mg total) into the skin daily before supper.   lisinopril 20 MG tablet Commonly known as:  PRINIVIL,ZESTRIL Take 1 tablet (20 mg total) by mouth  daily.   omeprazole 20 MG tablet Commonly known as:  PRILOSEC OTC Take 1 tablet (20 mg total) by mouth daily.   sertraline 100 MG tablet Commonly known as:  ZOLOFT 100 mg. Take tablet at night   simvastatin 20 MG tablet Commonly known as:  ZOCOR Take 1 tablet (20 mg total) by mouth daily.       Allergies:  Allergies  Allergen Reactions  . Januvia [Sitagliptin] Other (See Comments)    Hx of pancreatitis  . Metformin And Related Diarrhea    Past Medical History:  Diagnosis Date  . Anemia   . Anxiety   . Arthritis    right little finger  . Complication of anesthesia    10/2014-facial swelling after surgery-noticed in recovery room  . Depression    hx of suicide attempts in past (none for several years as of 10/12/14 - per pt)  . Diabetes mellitus without complication (HCC)   . GERD (gastroesophageal reflux disease)   . High cholesterol   . Hypertension   . Pancreatitis    2 yrs ago- no problem now.    Past Surgical History:  Procedure Laterality Date  . ABDOMINAL HYSTERECTOMY    . BACK SURGERY     lumbar surgery  . BALLOON DILATION N/A 12/23/2014   Procedure: BALLOON DILATION;  Surgeon: Willis Modena, MD;  Location: [facility];  Service: Endoscopy;  Laterality: N/A;  . BALLOON DILATION N/A 02/17/2015   Procedure: BALLOON DILATION;  Surgeon: Willis Modena, MD;  Location: [facility];  Service: Endoscopy;  Laterality: N/A;  . BALLOON DILATION N/A 04/07/2015   Procedure: BALLOON DILATION;  Surgeon: Willis Modena, MD;  Location: [facility];  Service: Endoscopy;  Laterality: N/A;  . BALLOON DILATION N/A 04/21/2015   Procedure: BALLOON DILATION;  Surgeon: Willis Modena, MD;  Location: [facility];  Service: Endoscopy;  Laterality: N/A;  . CHOLECYSTECTOMY     laparoscopic  . COLONOSCOPY    . COLONOSCOPY WITH PROPOFOL N/A 12/23/2014   Procedure: COLONOSCOPY WITH PROPOFOL;  Surgeon: Willis Modena, MD;  Location: [facility];  Service: Endoscopy;  Laterality: N/A;   . ESOPHAGOGASTRODUODENOSCOPY (EGD) WITH PROPOFOL N/A 12/23/2014   Procedure: ESOPHAGOGASTRODUODENOSCOPY (EGD) WITH PROPOFOL;  Surgeon: Willis Modena, MD;  Location: [facility];  Service: Endoscopy;  Laterality: N/A;  . ESOPHAGOGASTRODUODENOSCOPY (EGD) WITH PROPOFOL N/A 02/17/2015   Procedure: ESOPHAGOGASTRODUODENOSCOPY (EGD) WITH PROPOFOL;  Surgeon: Willis Modena, MD;  Location: [facility];  Service: Endoscopy;  Laterality:  N/A;  with balloon dilation  . ESOPHAGOGASTRODUODENOSCOPY (EGD) WITH PROPOFOL N/A 04/07/2015   Procedure: ESOPHAGOGASTRODUODENOSCOPY (EGD) WITH PROPOFOL;  Surgeon: Willis ModenaWilliam Outlaw, MD;  Location: [facility];  Service: Endoscopy;  Laterality: N/A;  . ESOPHAGOGASTRODUODENOSCOPY (EGD) WITH PROPOFOL N/A 04/21/2015   Procedure: ESOPHAGOGASTRODUODENOSCOPY (EGD) WITH PROPOFOL;  Surgeon: Willis ModenaWilliam Outlaw, MD;  Location: [facility];  Service: Endoscopy;  Laterality: N/A;  . SHOULDER SURGERY Left    repair tendon tear  . TONSILLECTOMY      Family History  Problem Relation Age of Onset  . Alzheimer's disease Mother   . Cancer - Lung Father     Social History:  reports that she has never smoked. She has never used smokeless tobacco. She reports that she does not drink alcohol or use drugs.   Review of Systems  Lipid history: could not do [place]; she cannot swallow fenofibrate tablets    Lab Results  Component Value Date   CHOL 168 12/18/2016   HDL 36.90 (L) 12/18/2016   LDLCALC 57 04/19/2016   LDLDIRECT 57.0 12/18/2016   TRIG 313.0 (H) 12/18/2016   CHOLHDL 5 12/18/2016           Hypertension: Has been treated with lisinopril  Most recent eye exam was 4/17  Most recent foot exam:1/18  Complications of diabetes: Peripheral neuropathy with sensory loss, symptomatically feels better   LABS:  Lab on 12/18/2016  Component Date Value Ref Range Status  . Hgb A1c MFr Bld 12/18/2016 8.0* 4.6 - 6.5 % Final   Glycemic Control Guidelines for People with Diabetes:Non  Diabetic:  <6%Goal of Therapy: <7%Additional Action Suggested:  >8%   . Sodium 12/18/2016 136  135 - 145 mEq/L Final  . Potassium 12/18/2016 4.1  3.5 - 5.1 mEq/L Final  . Chloride 12/18/2016 104  96 - 112 mEq/L Final  . CO2 12/18/2016 25  19 - 32 mEq/L Final  . Glucose, Bld 12/18/2016 130* 70 - 99 mg/dL Final  . BUN 08/65/784607/02/2017 22  6 - 23 mg/dL Final  . Creatinine, Ser 12/18/2016 1.09  0.40 - 1.20 mg/dL Final  . Total Bilirubin 12/18/2016 0.5  0.2 - 1.2 mg/dL Final  . Alkaline Phosphatase 12/18/2016 99  39 - 117 U/L Final  . AST 12/18/2016 18  0 - 37 U/L Final  . ALT 12/18/2016 16  0 - 35 U/L Final  . Total Protein 12/18/2016 7.3  6.0 - 8.3 g/dL Final  . Albumin 96/29/528407/02/2017 3.9  3.5 - 5.2 g/dL Final  . Calcium 13/24/401007/02/2017 9.8  8.4 - 10.5 mg/dL Final  . GFR 27/25/366407/02/2017 54.05* >60.00 mL/min Final  . Cholesterol 12/18/2016 168  0 - 200 mg/dL Final   ATP III Classification       Desirable:  < 200 mg/dL               Borderline High:  200 - 239 mg/dL          High:  > = 403240 mg/dL  . Triglycerides 12/18/2016 313.0* 0.0 - 149.0 mg/dL Final   Normal:  <474<150 mg/dLBorderline High:  150 - 199 mg/dL  . HDL 12/18/2016 36.90* >39.00 mg/dL Final  . VLDL 25/95/638707/02/2017 62.6* 0.0 - 40.0 mg/dL Final  . Total CHOL/HDL Ratio 12/18/2016 5   Final                  Men          Women1/2 Average Risk     3.4  3.3Average Risk          5.0          4.42X Average Risk          9.6          7.13X Average Risk          15.0          11.0                      . NonHDL 12/18/2016 130.89   Final   NOTE:  Non-HDL goal should be 30 mg/dL higher than patient's LDL goal (i.e. LDL goal of < 70 mg/dL, would have non-HDL goal of < 100 mg/dL)  . Direct LDL 12/18/2016 57.0  mg/dL Final   Optimal:  <161 mg/dLNear or Above Optimal:  100-129 mg/dLBorderline High:  130-159 mg/dLHigh:  160-189 mg/dLVery High:  >190 mg/dL    Physical Examination:  Pulse (!) 110   Ht 5\' 4"  (1.626 m)   Wt 181 lb 6.4 oz (82.3 kg)   SpO2 98%   BMI  31.14 kg/m     ASSESSMENT:  Diabetes type 2, uncontrolled    See history of present illness for detailed discussion of current diabetes management, blood sugar patterns and problems identified  She is on basal insulin and Victoza 1.2 mg As discussed above she probably has postprandial hyperglycemia which she is not monitoring Also has been inconsistent with diet and not exercising With lab fasting glucose 130 most likely her Lantus dose does not need to be change  HYPERLIPIDEMIA: She has triglycerides over 300, currently only on simvastatin She does have esophageal dilatation may be able to start taking fish oil or fenofibrate  PLAN:     Try to check blood sugars at least every other day especially after supper and she will call if they are over-80 consistently  Change Lantus to [place] on her next prescription, same dosage  Victoza 1.8 mg daily  More consistent diet   Follow-up in 3 months  Patient Instructions  Check blood sugars on waking up  weekly  Also check blood sugars about 2 hours after a meal and do this after different meals by rotation  Recommended blood sugar levels on waking up is 90-130 and about 2 hours after meal is 130-160  Call if high  Please bring your blood sugar monitor to each visit, thank you  Call when out of Lantus  Victoza 1.8 mg daily    [name] 12/21/2016, 8:57 AM   Note: This office note was prepared with Insurance underwriter. Any transcriptional errors that result from this process are unintentional.

## 2016-12-21 ENCOUNTER — Ambulatory Visit (INDEPENDENT_AMBULATORY_CARE_PROVIDER_SITE_OTHER): Payer: Medicare Other | Admitting: Endocrinology

## 2016-12-21 ENCOUNTER — Encounter: Payer: Self-pay | Admitting: Endocrinology

## 2016-12-21 VITALS — BP 126/74 | HR 110 | Ht 64.0 in | Wt 181.4 lb

## 2016-12-21 DIAGNOSIS — E1165 Type 2 diabetes mellitus with hyperglycemia: Secondary | ICD-10-CM

## 2016-12-21 DIAGNOSIS — Z794 Long term (current) use of insulin: Secondary | ICD-10-CM

## 2016-12-21 MED ORDER — LIRAGLUTIDE 18 MG/3ML ~~LOC~~ SOPN: 1.8000 mg | PEN_INJECTOR | Freq: Every day | SUBCUTANEOUS | 2 refills | Status: DC

## 2016-12-21 NOTE — Patient Instructions (Addendum)
Check blood sugars on waking up  weekly  Also check blood sugars about 2 hours after a meal and do this after different meals by rotation  Recommended blood sugar levels on waking up is 90-130 and about 2 hours after meal is 130-160  Call if high  Please bring your blood sugar monitor to each visit, thank you  Call when out of Lantus  Victoza 1.8 mg daily

## 2017-01-29 ENCOUNTER — Other Ambulatory Visit: Payer: Self-pay | Admitting: Endocrinology

## 2017-01-30 ENCOUNTER — Other Ambulatory Visit: Payer: Self-pay | Admitting: Endocrinology

## 2017-03-15 ENCOUNTER — Telehealth: Payer: Self-pay | Admitting: Endocrinology

## 2017-03-15 NOTE — Telephone Encounter (Signed)
Please review

## 2017-03-15 NOTE — Telephone Encounter (Signed)
Is there a way to send this to medical records? Please advise. Thanks!

## 2017-03-15 NOTE — Telephone Encounter (Signed)
Patient has moved and needs all her records to be faxed over to Dr. Maryella Shivers [facility]. (fax: [phone]) (phone: [phone]).

## 2017-03-20 ENCOUNTER — Other Ambulatory Visit: Payer: Self-pay

## 2017-03-23 ENCOUNTER — Ambulatory Visit: Payer: Self-pay | Admitting: Endocrinology

## 2017-04-11 ENCOUNTER — Telehealth: Payer: Self-pay | Admitting: Endocrinology

## 2017-04-11 NOTE — Telephone Encounter (Signed)
Dr. Fritz PickerelKenneth [name] has Wendy Ravelingynthia [name] in office now and is asking for recent OV and labs.  CMA Joesph FillersM Faggae rinted, records faxed to [phone]

## 2017-04-20 ENCOUNTER — Telehealth: Payer: Self-pay | Admitting: Endocrinology

## 2017-04-20 NOTE — Telephone Encounter (Signed)
Pt called about needing a refill for the insulin and the liraglutide (VICTOZA) 18 MG/3ML SOPN. Pt has moved to [place] and needs the medication until she can find a provider. Thank you!  Pharmacy is [facility] [phone].   Call pt @ [phone].

## 2017-04-23 ENCOUNTER — Other Ambulatory Visit: Payer: Self-pay

## 2017-04-23 DIAGNOSIS — E1165 Type 2 diabetes mellitus with hyperglycemia: Secondary | ICD-10-CM

## 2017-04-23 MED ORDER — LIRAGLUTIDE 18 MG/3ML ~~LOC~~ SOPN: 1.8000 mg | PEN_INJECTOR | Freq: Every day | SUBCUTANEOUS | 0 refills | Status: AC

## 2017-04-23 NOTE — Telephone Encounter (Signed)
We can only give her 30 day prescription

## 2017-04-23 NOTE — Telephone Encounter (Signed)
Please advise if okay to fill her Victoza per the message.

## 2017-04-23 NOTE — Telephone Encounter (Signed)
I have sent in this refill per Dr. Lucianne MussKumar and I have also let the patient know that this is the last refill we can do for 30 days until she can find a new provider.

## 2017-05-25 ENCOUNTER — Other Ambulatory Visit: Payer: Self-pay | Admitting: Endocrinology

## 2017-07-18 ENCOUNTER — Other Ambulatory Visit: Payer: Self-pay | Admitting: Endocrinology
